# Patient Record
Sex: Female | Born: 1940
Health system: Southern US, Community
[De-identification: ages and names within clinical notes are randomized; demographics above are authoritative.]

---

## 2016-01-15 DIAGNOSIS — H25811 Combined forms of age-related cataract, right eye: Secondary | ICD-10-CM | POA: Diagnosis not present

## 2016-01-15 DIAGNOSIS — H40052 Ocular hypertension, left eye: Secondary | ICD-10-CM | POA: Diagnosis not present

## 2016-01-15 DIAGNOSIS — H25812 Combined forms of age-related cataract, left eye: Secondary | ICD-10-CM | POA: Diagnosis not present

## 2016-01-26 DIAGNOSIS — H2512 Age-related nuclear cataract, left eye: Secondary | ICD-10-CM | POA: Diagnosis not present

## 2016-01-26 DIAGNOSIS — H25812 Combined forms of age-related cataract, left eye: Secondary | ICD-10-CM | POA: Diagnosis not present

## 2016-02-26 DIAGNOSIS — H2511 Age-related nuclear cataract, right eye: Secondary | ICD-10-CM | POA: Diagnosis not present

## 2016-02-26 DIAGNOSIS — H25811 Combined forms of age-related cataract, right eye: Secondary | ICD-10-CM | POA: Diagnosis not present

## 2017-08-29 ENCOUNTER — Encounter (HOSPITAL_COMMUNITY): Payer: Self-pay

## 2017-08-29 ENCOUNTER — Emergency Department (HOSPITAL_COMMUNITY): Payer: Medicare Other

## 2017-08-29 ENCOUNTER — Other Ambulatory Visit: Payer: Self-pay

## 2017-08-29 ENCOUNTER — Emergency Department (HOSPITAL_COMMUNITY)
Admission: EM | Admit: 2017-08-29 | Discharge: 2017-08-29 | Disposition: A | Discharge status: Home / Self Care | Payer: Medicare Other | Attending: Emergency Medicine | Admitting: Emergency Medicine

## 2017-08-29 DIAGNOSIS — R0789 Other chest pain: Secondary | ICD-10-CM | POA: Diagnosis not present

## 2017-08-29 DIAGNOSIS — Y929 Unspecified place or not applicable: Secondary | ICD-10-CM | POA: Insufficient documentation

## 2017-08-29 DIAGNOSIS — Z87891 Personal history of nicotine dependence: Secondary | ICD-10-CM | POA: Diagnosis not present

## 2017-08-29 DIAGNOSIS — Y999 Unspecified external cause status: Secondary | ICD-10-CM | POA: Insufficient documentation

## 2017-08-29 DIAGNOSIS — X58XXXA Exposure to other specified factors, initial encounter: Secondary | ICD-10-CM | POA: Diagnosis not present

## 2017-08-29 DIAGNOSIS — Y9301 Activity, walking, marching and hiking: Secondary | ICD-10-CM | POA: Insufficient documentation

## 2017-08-29 DIAGNOSIS — S299XXA Unspecified injury of thorax, initial encounter: Secondary | ICD-10-CM | POA: Diagnosis present

## 2017-08-29 DIAGNOSIS — S29012A Strain of muscle and tendon of back wall of thorax, initial encounter: Secondary | ICD-10-CM

## 2017-08-29 DIAGNOSIS — I10 Essential (primary) hypertension: Secondary | ICD-10-CM

## 2017-08-29 DIAGNOSIS — R079 Chest pain, unspecified: Secondary | ICD-10-CM | POA: Diagnosis not present

## 2017-08-29 LAB — CBC
HEMATOCRIT: 43.7 % (ref 36.0–46.0)
Hemoglobin: 14.2 g/dL (ref 12.0–15.0)
MCH: 30.5 pg (ref 26.0–34.0)
MCHC: 32.5 g/dL (ref 30.0–36.0)
MCV: 93.8 fL (ref 78.0–100.0)
Platelets: 365 10*3/uL (ref 150–400)
RBC: 4.66 MIL/uL (ref 3.87–5.11)
RDW: 12.4 % (ref 11.5–15.5)
WBC: 8.8 10*3/uL (ref 4.0–10.5)

## 2017-08-29 LAB — BASIC METABOLIC PANEL
Anion gap: 10 (ref 5–15)
BUN: 15 mg/dL (ref 8–23)
CO2: 25 mmol/L (ref 22–32)
Calcium: 9.5 mg/dL (ref 8.9–10.3)
Chloride: 106 mmol/L (ref 98–111)
Creatinine, Ser: 0.91 mg/dL (ref 0.44–1.00)
GFR calc Af Amer: >60 mL/min (ref >60)
GFR, EST NON AFRICAN AMERICAN: 59 mL/min — AB (ref >60)
Glucose, Bld: 112 mg/dL — ABNORMAL HIGH (ref 70–99)
POTASSIUM: 3.9 mmol/L (ref 3.5–5.1)
SODIUM: 141 mmol/L (ref 135–145)

## 2017-08-29 LAB — I-STAT TROPONIN, ED: TROPONIN I, POC: 0.02 ng/mL (ref 0.00–0.08)

## 2017-08-29 MED ORDER — METHOCARBAMOL 500 MG PO TABS: 500.0000 mg | ORAL_TABLET | Freq: Two times a day (BID) | ORAL | 0 refills | Status: AC | PRN

## 2017-08-29 MED ORDER — METHOCARBAMOL 500 MG PO TABS
500.0000 mg | ORAL_TABLET | Freq: Once | ORAL | Status: AC
  Administered 2017-08-29: 500 mg via ORAL
  Filled 2017-08-29: qty 1

## 2017-08-29 NOTE — ED Notes (Signed)
ED Provider at bedside. 

## 2017-08-29 NOTE — ED Provider Notes (Signed)
[facility] EMERGENCY DEPARTMENT Provider Note   CSN: [id number] Arrival date & time: 08/29/17  1051     History   Chief Complaint Chief Complaint  Patient presents with  . Chest Pain    HPI Regina Bautista is a 77 y.o. female.  HPI 77 year old female presents with a chief complaint of thoracic back pain.  Started 5 days ago while she was walking her dog.  Her dog likes to pull and tug and while she was holding the leash with her right arm she felt some pain in her thoracic back.  She is been trying ice, icy spray, ibuprofen until yesterday and Tylenol.  Some partial relief with these.  Some mild pain in her right upper arm and in her right chest.  No shortness of breath or pleuritic pain.  Movement worsens the pain.  No weakness or numbness or radiation of the pain.  No abdominal pain or vomiting.  No cough.  Pain is about an 8 out of 10.  She checked her blood pressure this morning and it was 190 systolic and it was telling her that she had an irregular heartbeat so she came to get checked out.  She has not felt any palpitations.  History reviewed. No pertinent past medical history.  There are no active problems to display for this patient.   History reviewed. No pertinent surgical history.   OB History   None      Home Medications    Prior to Admission medications   Not on File    Family History History reviewed. No pertinent family history.  Social History Social History   Tobacco Use  . Smoking status: Former Smoker    Last attempt to quit: 08/16/2017    Years since quitting: 0.0  . Smokeless tobacco: Never Used  Substance Use Topics  . Alcohol use: Yes    Comment: occ  . Drug use: Not on file     Allergies   Penicillins and Sulfa antibiotics   Review of Systems Review of Systems  Respiratory: Negative for shortness of breath.   Cardiovascular: Positive for chest pain.  Gastrointestinal: Negative for abdominal pain and vomiting.    Musculoskeletal: Positive for back pain and myalgias.  Neurological: Negative for weakness and numbness.  All other systems reviewed and are negative.    Physical Exam Updated Vital Signs BP (!) 147/84 (BP Location: Right Arm)   Pulse 93   Temp 98.1 F (36.7 C) (Oral)   Resp 18   SpO2 96%   Physical Exam  Constitutional: She is oriented to person, place, and time. She appears well-developed and well-nourished.  HENT:  Head: Normocephalic and atraumatic.  Right Ear: External ear normal.  Left Ear: External ear normal.  Nose: Nose normal.  Eyes: Right eye exhibits no discharge. Left eye exhibits no discharge.  Cardiovascular: Normal rate, regular rhythm and normal heart sounds.  Pulses:      Radial pulses are 2+ on the right side, and 2+ on the left side.  Pulmonary/Chest: Effort normal and breath sounds normal. She exhibits tenderness (mild).    Abdominal: Soft. There is no tenderness.  Musculoskeletal:       Cervical back: She exhibits tenderness.       Thoracic back: She exhibits tenderness.       Back:       Right upper arm: She exhibits tenderness (mild, posterior, no swelling). She exhibits no bony tenderness.  Neurological: She is alert and oriented  to person, place, and time.  Skin: Skin is warm and dry.  Nursing note and vitals reviewed.    ED Treatments / Results  Labs (all labs ordered are listed, but only abnormal results are displayed) Labs Reviewed  BASIC METABOLIC PANEL - Abnormal; Notable for the following components:      Result Value   Glucose, Bld 112 (*)    GFR calc non Af Amer 59 (*)    All other components within normal limits  CBC  I-STAT TROPONIN, ED    EKG EKG Interpretation  Date/Time:  Monday August 29 2017 10:57:35 EDT Ventricular Rate:  94 PR Interval:  142 QRS Duration: 64 QT Interval:  352 QTC Calculation: 440 R Axis:   11 Text Interpretation:  Normal sinus rhythm with sinus arrhythmia Cannot rule out Anterior infarct ,  age undetermined Abnormal ECG nonspecific ST segments diffusely Confirmed by Pricilla LovelessGoldston, Earma Nicolaou [phone]([id number]) on 08/29/2017 11:35:29 AM   EKG Interpretation  Date/Time:  Monday August 29 2017 12:26:03 EDT Ventricular Rate:  80 PR Interval:  142 QRS Duration: 81 QT Interval:  390 QTC Calculation: 450 R Axis:   12 Text Interpretation:  Sinus rhythm nonspecific ST segments, unchanged from earlier in the day Confirmed by Pricilla LovelessGoldston, Kori Colin [phone]([id number]) on 08/29/2017 12:33:25 PM       Radiology Dg Chest 2 View  Result Date: 08/29/2017 CLINICAL DATA:  Right-sided chest pain EXAM: CHEST - 2 VIEW COMPARISON:  None. FINDINGS: The heart size and mediastinal contours are within normal limits. Both lungs are hyperinflated. The visualized skeletal structures are unremarkable. IMPRESSION: COPD without acute abnormality. Electronically Signed   By: Alcide CleverMark  Lukens M.D.   On: 08/29/2017 11:20    Procedures Procedures (including critical care time)  Medications Ordered in ED Medications  methocarbamol (ROBAXIN) tablet 500 mg (has no administration in time range)     Initial Impression / Assessment and Plan / ED Course  I have reviewed the triage vital signs and the nursing notes.  Pertinent labs & imaging results that were available during my care of the patient were reviewed by me and considered in my medical decision making (see chart for details).     Patient's pain appears to be muscular.  While there is a little bit of right-sided chest pain this also appears muscular.  I highly doubt ACS, PE, dissection.  She is noted to be hypertensive although she is also uncomfortable and in pain.  In this acute episode I do not think she needs to be immediately started on blood pressure medicine but needs to find a PCP for better outpatient management of this and reevaluation.  I will start her on a muscle relaxer and encouraged her to use low-dose NSAIDs and Tylenol.  She has some nonspecific ST segments but no significant  elevation or depression to suggest a coronary cause.  These are unchanging.  Given the length of symptoms, with a negative troponin I do not think further troponin testing is needed.  We discussed return precautions but she appears stable for discharge home.  Final Clinical Impressions(s) / ED Diagnoses   Final diagnoses:  Strain of thoracic back region  Essential hypertension    ED Discharge Orders    None       Pricilla LovelessGoldston, Kairen Hallinan, MD 08/29/17 1622

## 2017-08-29 NOTE — ED Triage Notes (Signed)
Pt states she was walking her dog and he jerked her. She states she initially thought maybe she pulled a muscle but she has had persistent chest tightness and pain in her right arm. Pt also reports hypertension this morning.

## 2017-08-29 NOTE — ED Notes (Signed)
Patient ready for discharge VS documented

## 2017-08-29 NOTE — Discharge Instructions (Addendum)
If your pain worsens or you develop weakness or numbness in your arms or legs, incontinence, headache, chest pain or shortness of breath, vomiting, or any other new/concerning symptoms and return to the ER for evaluation.  Do not combine the muscle relaxer, Robaxin, with any other medicines, alcohol.  Do not drive or operate heavy machinery while using this medicine.  You will need to get your blood pressure rechecked with a primary care doctor

## 2017-09-20 DIAGNOSIS — R030 Elevated blood-pressure reading, without diagnosis of hypertension: Secondary | ICD-10-CM | POA: Diagnosis not present

## 2017-09-20 DIAGNOSIS — M5412 Radiculopathy, cervical region: Secondary | ICD-10-CM | POA: Diagnosis not present

## 2017-09-21 ENCOUNTER — Inpatient Hospital Stay: Payer: Medicare Other

## 2017-10-03 ENCOUNTER — Ambulatory Visit: Payer: Medicare Other | Attending: Family Medicine

## 2017-10-03 ENCOUNTER — Other Ambulatory Visit: Payer: Self-pay

## 2017-10-03 DIAGNOSIS — M79601 Pain in right arm: Secondary | ICD-10-CM

## 2017-10-03 DIAGNOSIS — R293 Abnormal posture: Secondary | ICD-10-CM | POA: Diagnosis not present

## 2017-10-03 DIAGNOSIS — M542 Cervicalgia: Secondary | ICD-10-CM | POA: Diagnosis not present

## 2017-10-03 DIAGNOSIS — R252 Cramp and spasm: Secondary | ICD-10-CM | POA: Diagnosis not present

## 2017-10-03 NOTE — Patient Instructions (Signed)
Access Code: [email or web address]  URL: [email or web address]  Date: 10/03/2017  Prepared by: Lorrene Reid   Exercises  Seated Cervical Sidebending AROM - 10 reps - 3 sets - 1x daily - 7x weekly  Seated Cervical Retraction - 10 reps - 3 sets - 5 hold - 3x daily - 7x weekly  Seated Correct Posture - 10 reps - 3 sets - 1x daily - 7x weekly  Supine Chin Tuck - 10 reps - 3 sets - 1x daily - 7x weekly  Seated Scapular Retraction - 10 reps - 2 sets - 5 hold - 3x daily - 7x weekly

## 2017-10-03 NOTE — Therapy (Signed)
[facility] [address], [address] [place], [place], [address] Phone: [phone]   Fax:  [phone]  Physical Therapy Evaluation  Patient Details  Name: Regina Bautista MRN: [id number] Date of Birth: 1940-08-14 Referring Provider (PT): Darrow Bussing, MD   Encounter Date: 10/03/2017  PT End of Session - 10/03/17 1329    Visit Number  1    Date for PT Re-Evaluation  11/28/17    Authorization Type  Medicare    PT Start Time  1233    PT Stop Time  1313    PT Time Calculation (min)  40 min    Activity Tolerance  Patient tolerated treatment well    Behavior During Therapy  [facility] for tasks assessed/performed       History reviewed. No pertinent past medical history.  History reviewed. No pertinent surgical history.  There were no vitals filed for this visit.   Subjective Assessment - 10/03/17 1233    Subjective  Pt presents to PT with Rt UE radiculopathy that began 08/17/17.  Pt was walking her dog and the dog pulled on her arm and pain began at that time.  Pt reports that she has constant Rt UE radiculopathy and has difficulty sleeping at night.      Diagnostic tests  none    Patient Stated Goals  sleep better, reduce Rt UE pain, improve use of Rt UE    Currently in Pain?  Yes    Pain Score  8     Pain Location  Arm    Pain Orientation  Right    Pain Descriptors / Indicators  Numbness;Tingling    Pain Type  Acute pain    Pain Onset  More than a month ago    Pain Frequency  Constant    Aggravating Factors   sleep at night, brushing teeth, use of Rt UE to clean     Pain Relieving Factors  propping arm, arm overhead         [facility] PT Assessment - 10/03/17 0001      Assessment   Medical Diagnosis  cervical radicuopathy    Referring Provider (PT)  Docia Chuck, Dibas, MD    Onset Date/Surgical Date  08/17/17    Hand Dominance  Right    Next MD Visit  none    Prior Therapy  none      Precautions   Precautions  None       Restrictions   Weight Bearing Restrictions  No      Balance Screen   Has the patient fallen in the past 6 months  No    Has the patient had a decrease in activity level because of a fear of falling?   No    Is the patient reluctant to leave their home because of a fear of falling?   No      Home Public house manager residence    Living Arrangements  Alone    Home Layout  Two level      Prior Function   Level of Independence  Independent    Vocation  Retired    Leisure  walk dog, Public house manager   Overall Cognitive Status  Within Functional Limits for tasks assessed      Observation/Other Assessments   Focus on Therapeutic Outcomes (FOTO)   37% limitation      Posture/Postural Control   Posture/Postural Control  Postural limitations  Postural Limitations  Rounded Shoulders;Weight shift left;Weight shift right      ROM / Strength   AROM / PROM / Strength  AROM;Strength      AROM   Overall AROM   Within functional limits for tasks performed    Overall AROM Comments  cervical A/ROM is full without change in Rt UE pain.  UE A/ROM is full without change in UE pain      Strength   Overall Strength  Within functional limits for tasks performed    Overall Strength Comments  Rt=Lt UE strength.  Lt grip 48#, Rt grip 45#      Palpation   Spinal mobility  reduced PA mobiltiy C3-6 without pain    Palpation comment  palpable tension and trigger points over Rt upper trap, Rt medial scapular and thoracic paraspinals      Special Tests    Special Tests  Cervical;Thoracic Outlet Syndrome    Cervical Tests  Dictraction    Thoracic Outlet Syndrome   Costcoclavicular Syndrome Test;Arms overhead      Distraction Test   Findngs  Positive    side  Right      Costcoclavicular Syndrome Test   Findings  Negative    Side  Right      Arms overhead    Findings  Negative    Side  Right      Ambulation/Gait   Gait Pattern  Within Functional Limits                 Objective measurements completed on examination: See above findings.              PT Education - 10/03/17 1310    Education Details   Access Code: [email or web address]     Person(s) Educated  Patient    Methods  Explanation;Demonstration;Handout    Comprehension  Verbalized understanding;Returned demonstration       PT Short Term Goals - 10/03/17 1226      PT SHORT TERM GOAL #1   Title  be independent in initial HEP    Time  4    Period  Weeks    Status  New    Target Date  10/31/17      PT SHORT TERM GOAL #2   Title  report a 40% reduction in Rt UE with ADLs and self-care    Time  4    Period  Weeks    Status  New    Target Date  10/31/17      PT SHORT TERM GOAL #3   Title  reduce Rt UE pain to report  50% improvement in quality and quantity of sleep at night    Time  4    Period  Weeks    Status  New    Target Date  10/31/17        PT Long Term Goals - 10/03/17 1228      PT LONG TERM GOAL #1   Title  be independent in advanced HEP    Time  8    Period  Weeks    Status  New    Target Date  11/28/17      PT LONG TERM GOAL #2   Title  reduce FOTO to < or = to 28% limitation    Time  8    Period  Weeks    Status  New    Target Date  11/28/17      PT LONG TERM GOAL #  3   Title  report a 75% reduction in Rt UE radiculopathy to improve use of Rt UE with ADLs and self-care    Time  8    Period  Weeks    Status  New    Target Date  11/28/17      PT LONG TERM GOAL #4   Title  report waking < or = to 1x/night with Rt UE pain    Time  8    Period  Weeks    Status  New    Target Date  11/28/17      PT LONG TERM GOAL #5   Title  demonstrate neutral seated posture and report postural corrections with painting and sitting for long periods    Time  8    Period  Weeks    Status  New    Target Date  11/28/17             Plan - 10/03/17 1326    Clinical Impression Statement  Pt is a Rt hand dominant female who presents to PT with  complaints of 8-10/10 Rt UE radiculopathy that began ~6 weeks ago when her dog pulled her while walking.  Pt has not had any imaging.  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  Pt demonstrates full cervical A/ROM with Rt sided neck pain reported with all cervical A/ROM.  Pt with forward head and rounded shoulder posture and lateral trunk shift with sitting during evaluation.  Pt with palpable trigger points in Rt upper trap and scapula.  Pt will benefit from skilled PT for cervical traction, postural strength, dry needling and modalities to address neck and UE pain.      History and Personal Factors relevant to plan of care:  none    Clinical Presentation  Stable    Clinical Presentation due to:  Rt UE radiculopathy- no other comorbidities    Clinical Decision Making  Low    Rehab Potential  Excellent    PT Frequency  2x / week    PT Duration  8 weeks    PT Treatment/Interventions  ADLs/Self Care Home Management;Cryotherapy;Electrical Stimulation;Ultrasound;Traction;Iontophoresis 4mg /ml Dexamethasone;Therapeutic activities;Therapeutic exercise;Patient/family education;Neuromuscular re-education;Manual techniques;Passive range of motion;Taping;Dry needling    PT Next Visit Plan  cervical traction, dry needling to Rt upper trap and scapula, manual     PT Home Exercise Plan  Access Code: [email or web address]     Consulted and Agree with Plan of Care  Patient       Patient will benefit from skilled therapeutic intervention in order to improve the following deficits and impairments:  Impaired UE functional use, Decreased activity tolerance, Postural dysfunction, Increased muscle spasms, Improper body mechanics, Pain  Visit Diagnosis: Pain in right arm - Plan: PT plan of care cert/re-cert  Cervicalgia - Plan: PT plan of care cert/re-cert  Abnormal posture - Plan: PT plan of care cert/re-cert  Cramp and spasm - Plan: PT plan of care  cert/re-cert     Problem List There are no active problems to display for this patient.    Lorrene Reid, PT 10/03/17 1:30 PM   [facility] [address], [address] [place], [place], [address] Phone: [phone]   Fax:  [phone]  Name: Regina Bautista MRN: [id number] Date of Birth: 1940/10/13

## 2017-10-07 ENCOUNTER — Encounter

## 2017-10-10 ENCOUNTER — Encounter: Payer: Self-pay | Admitting: Physical Therapy

## 2017-10-10 ENCOUNTER — Ambulatory Visit: Payer: Medicare Other | Attending: Family Medicine | Admitting: Physical Therapy

## 2017-10-10 DIAGNOSIS — R293 Abnormal posture: Secondary | ICD-10-CM | POA: Insufficient documentation

## 2017-10-10 DIAGNOSIS — R252 Cramp and spasm: Secondary | ICD-10-CM

## 2017-10-10 DIAGNOSIS — M542 Cervicalgia: Secondary | ICD-10-CM | POA: Diagnosis not present

## 2017-10-10 DIAGNOSIS — M79601 Pain in right arm: Secondary | ICD-10-CM | POA: Diagnosis not present

## 2017-10-10 NOTE — Therapy (Signed)
[facility] [address] [place], [place], [address] Phone: [phone]   Fax:  [phone]  Physical Therapy Treatment  Patient Details  Name: Regina Bautista MRN: [id number] Date of Birth: 1940/02/04 Referring Provider (PT): Darrow Bussing, MD   Encounter Date: 10/10/2017  PT End of Session - 10/10/17 1529    Visit Number  2    Date for PT Re-Evaluation  11/28/17    Authorization Type  Medicare    PT Start Time  1530    PT Stop Time  1612    PT Time Calculation (min)  42 min    Activity Tolerance  Patient tolerated treatment well    Behavior During Therapy  [facility] for tasks assessed/performed       History reviewed. No pertinent past medical history.  History reviewed. No pertinent surgical history.  There were no vitals filed for this visit.  Subjective Assessment - 10/10/17 1535    Subjective  Symptoms are about the same. I am doing my HEP faithfully.    Currently in Pain?  Yes    Pain Score  6     Pain Location  Shoulder    Pain Orientation  Right;Posterior   and down arm RTUE   Pain Descriptors / Indicators  Tingling;Tightness    Aggravating Factors   brushing teeth, doing dishes    Pain Relieving Factors  propping up arm    Multiple Pain Sites  No                       [facility] Adult PT Treatment/Exercise - 10/10/17 0001      Neck Exercises: Seated   Neck Retraction  10 reps   VC to go easy with effort   Lateral Flexion  Both;10 reps   VC to practice relaxing her shoulders, not hiking   Other Seated Exercise  scap squeezes 10x   VC to ease effort and glide scap together     Traction   Type of Traction  Cervical   Towel props upper arm Bil   Min (lbs)  5    Max (lbs)  12    Hold Time  60    Rest Time  20    Time  15      Neck Exercises: Stretches   Neural Stretch  RTUE ulnar n stretch: 3x 5 sec added to HEP    Other Neck Stretches  Ball on the wall at RT scapula for home TP  work             PT Education - 10/10/17 1552    Education Details  Ulnar glides for RTUE for HEP    Person(s) Educated  Patient    Methods  Explanation;Demonstration;Tactile cues;Verbal cues;Handout    Comprehension  Returned demonstration;Verbalized understanding       PT Short Term Goals - 10/03/17 1226      PT SHORT TERM GOAL #1   Title  be independent in initial HEP    Time  4    Period  Weeks    Status  New    Target Date  10/31/17      PT SHORT TERM GOAL #2   Title  report a 40% reduction in Rt UE with ADLs and self-care    Time  4    Period  Weeks    Status  New    Target Date  10/31/17      PT  SHORT TERM GOAL #3   Title  reduce Rt UE pain to report  50% improvement in quality and quantity of sleep at night    Time  4    Period  Weeks    Status  New    Target Date  10/31/17        PT Long Term Goals - 10/03/17 1228      PT LONG TERM GOAL #1   Title  be independent in advanced HEP    Time  8    Period  Weeks    Status  New    Target Date  11/28/17      PT LONG TERM GOAL #2   Title  reduce FOTO to < or = to 28% limitation    Time  8    Period  Weeks    Status  New    Target Date  11/28/17      PT LONG TERM GOAL #3   Title  report a 75% reduction in Rt UE radiculopathy to improve use of Rt UE with ADLs and self-care    Time  8    Period  Weeks    Status  New    Target Date  11/28/17      PT LONG TERM GOAL #4   Title  report waking < or = to 1x/night with Rt UE pain    Time  8    Period  Weeks    Status  New    Target Date  11/28/17      PT LONG TERM GOAL #5   Title  demonstrate neutral seated posture and report postural corrections with painting and sitting for long periods    Time  8    Period  Weeks    Status  New    Target Date  11/28/17            Plan - 10/10/17 1530    Clinical Impression Statement  Pt compliant with her initial HEP. She needed follow up education today to decrease her effort/resistance with  retraction exercises as they would cause pain. Once she eased her efforts she had no pain performing the exercise. Mechanical cervical traction was started today. pt was also educated in how to use a tennis ball at home on her wall to defuse her Rt scapular trigger point.     Rehab Potential  Excellent    PT Frequency  2x / week    PT Duration  8 weeks    PT Treatment/Interventions  ADLs/Self Care Home Management;Cryotherapy;Electrical Stimulation;Ultrasound;Traction;Iontophoresis 4mg /ml Dexamethasone;Therapeutic activities;Therapeutic exercise;Patient/family education;Neuromuscular re-education;Manual techniques;Passive range of motion;Taping;Dry needling    PT Next Visit Plan  Assess cervical traction, see how new nerve stretch is going,  manual     PT Home Exercise Plan  Access Code: [email or web address]     Consulted and Agree with Plan of Care  Patient       Patient will benefit from skilled therapeutic intervention in order to improve the following deficits and impairments:  Impaired UE functional use, Decreased activity tolerance, Postural dysfunction, Increased muscle spasms, Improper body mechanics, Pain  Visit Diagnosis: Pain in right arm  Cervicalgia  Abnormal posture  Cramp and spasm     Problem List There are no active problems to display for this patient.   Khadijah Mastrianni, PTA 10/10/2017, 4:14 PM  [place] [facility] [address] [place], [place], [address] Phone: [phone]   Fax:  [phone]  Name:  Regina Bautista MRN: [id number] Date of Birth: 26-May-1940  Access Code: [email or web address]  URL: [email or web address]  Date: 10/10/2017  Prepared by: Ane Payment   Exercises  Seated Cervical Sidebending AROM - 10 reps - 3 sets - 1x daily - 7x weekly  Seated Cervical Retraction - 10 reps - 3 sets - 5 hold - 3x daily - 7x weekly  Seated Correct Posture - 10 reps - 3 sets - 1x daily - 7x weekly  Supine Chin  Tuck - 10 reps - 3 sets - 1x daily - 7x weekly  Seated Scapular Retraction - 10 reps - 2 sets - 5 hold - 3x daily - 7x weekly  Standing Ulnar Nerve Glide - 5 reps - 1 sets - 5 hold - 2x daily - 7x weekly

## 2017-10-10 NOTE — Patient Instructions (Signed)
For scapular squeezes:  1. Tall posture first, lift your heart GENTLY  2. GLIDE the shoulder blades together gently and recognize those muscles, then hold them 5 seconds.

## 2017-10-12 ENCOUNTER — Encounter: Payer: Self-pay | Admitting: Physical Therapy

## 2017-10-12 ENCOUNTER — Ambulatory Visit: Payer: Medicare Other | Admitting: Physical Therapy

## 2017-10-12 DIAGNOSIS — M542 Cervicalgia: Secondary | ICD-10-CM | POA: Diagnosis not present

## 2017-10-12 DIAGNOSIS — M79601 Pain in right arm: Secondary | ICD-10-CM | POA: Diagnosis not present

## 2017-10-12 DIAGNOSIS — R293 Abnormal posture: Secondary | ICD-10-CM | POA: Diagnosis not present

## 2017-10-12 DIAGNOSIS — R252 Cramp and spasm: Secondary | ICD-10-CM

## 2017-10-12 NOTE — Therapy (Signed)
[facility] [address], [address] [place], [place], [address] Phone: [phone]   Fax:  [phone]  Physical Therapy Treatment  Patient Details  Name: Regina Bautista MRN: [id number] Date of Birth: 1940/08/18 Referring Provider (PT): Darrow Bussing, MD   Encounter Date: 10/12/2017  PT End of Session - 10/12/17 1218    Visit Number  3    Date for PT Re-Evaluation  11/28/17    Authorization Type  Medicare    PT Start Time  1214    PT Stop Time  1310    PT Time Calculation (min)  56 min    Activity Tolerance  Patient tolerated treatment well    Behavior During Therapy  [facility] for tasks assessed/performed       History reviewed. No pertinent past medical history.  History reviewed. No pertinent surgical history.  There were no vitals filed for this visit.  Subjective Assessment - 10/12/17 1219    Subjective  Symptoms unchanged. Last session did not make me worse, but I remians with RTarm symptoms. Denies numbness or tingling this AM.     Patient Stated Goals  sleep better, reduce Rt UE pain, improve use of Rt UE    Currently in Pain?  Yes    Pain Score  5     Pain Location  Arm   Posterior aspect   Pain Orientation  Right    Pain Descriptors / Indicators  Sore    Multiple Pain Sites  No                       [facility] Adult PT Treatment/Exercise - 10/12/17 0001      Shoulder Exercises: Supine   Horizontal ABduction  Strengthening;Both;10 reps;Theraband    Theraband Level (Shoulder Horizontal ABduction)  Level 1 (Yellow)    Diagonals  Strengthening;Both;10 reps;Theraband   Bil   Theraband Level (Shoulder Diagonals)  Level 1 (Yellow)      Traction   Type of Traction  Cervical    Min (lbs)  5    Max (lbs)  14    Hold Time  60    Rest Time  20    Time  15      Neck Exercises: Stretches   Other Neck Stretches  Ball on the wall at RT scapula for home TP work    Other Neck Stretches  Slow head  rotations on blue foam roll 10x, then figure eights.10x             PT Education - 10/12/17 1237    Education Details  Radial glides & mobilization for HEP    Person(s) Educated  Patient    Methods  Explanation;Demonstration;Tactile cues;Verbal cues;Handout    Comprehension  Returned demonstration;Verbalized understanding       PT Short Term Goals - 10/12/17 1225      PT SHORT TERM GOAL #3   Title  reduce Rt UE pain to report  50% improvement in quality and quantity of sleep at night    Time  4    Period  Weeks    Status  On-going   Making progress, not as intense now not sure on %.       PT Long Term Goals - 10/03/17 1228      PT LONG TERM GOAL #1   Title  be independent in advanced HEP    Time  8    Period  Weeks    Status  New    Target Date  11/28/17      PT LONG TERM GOAL #2   Title  reduce FOTO to < or = to 28% limitation    Time  8    Period  Weeks    Status  New    Target Date  11/28/17      PT LONG TERM GOAL #3   Title  report a 75% reduction in Rt UE radiculopathy to improve use of Rt UE with ADLs and self-care    Time  8    Period  Weeks    Status  New    Target Date  11/28/17      PT LONG TERM GOAL #4   Title  report waking < or = to 1x/night with Rt UE pain    Time  8    Period  Weeks    Status  New    Target Date  11/28/17      PT LONG TERM GOAL #5   Title  demonstrate neutral seated posture and report postural corrections with painting and sitting for long periods    Time  8    Period  Weeks    Status  New    Target Date  11/28/17            Plan - 10/12/17 1218    Clinical Impression Statement  Symptoms remain unchanged per pt. She does report her sleeping has improved since her pain at night is more tolerable. She could not put a % on it today.  Mechanical traction did not increase her symptoms so we increased pull to 14# today. Additionally pt was given radial nerve stretches to replace the original nerve stretching for the  ulnar nerve. Radial nerve aappears to be more involved.     Rehab Potential  Excellent    PT Frequency  2x / week    PT Duration  8 weeks    PT Treatment/Interventions  ADLs/Self Care Home Management;Cryotherapy;Electrical Stimulation;Ultrasound;Traction;Iontophoresis 4mg /ml Dexamethasone;Therapeutic activities;Therapeutic exercise;Patient/family education;Neuromuscular re-education;Manual techniques;Passive range of motion;Taping;Dry needling    PT Next Visit Plan  Assess traction, review radial nerve stretch.     PT Home Exercise Plan  Access Code: [email or web address]     Consulted and Agree with Plan of Care  Patient       Patient will benefit from skilled therapeutic intervention in order to improve the following deficits and impairments:  Impaired UE functional use, Decreased activity tolerance, Postural dysfunction, Increased muscle spasms, Improper body mechanics, Pain  Visit Diagnosis: Pain in right arm  Cervicalgia  Abnormal posture  Cramp and spasm     Problem List There are no active problems to display for this patient.   Rosalba Totty, PTA 10/12/2017, 12:59 PM  [facility] [address] [place], [place], [address] Phone: [phone]   Fax:  [phone]  Name: Regina Bautista MRN: [id number] Date of Birth: 17-Aug-1940  Access Code: [email or web address]  URL: [email or web address]  Date: 10/12/2017  Prepared by: [employer]   Access Code: [email or web address]  URL: [email or web address]  Date: 10/12/2017  Prepared by: [employer]   Exercises  Seated Cervical Sidebending AROM - 10 reps - 3 sets - 1x daily - 7x weekly  Seated Cervical Retraction - 10 reps - 3 sets - 5 hold - 3x daily - 7x weekly  Seated Correct Posture - 10 reps - 3 sets - 1x daily - 7x weekly  Supine Chin Tuck - 10 reps -  3 sets - 1x daily - 7x weekly  Seated Scapular Retraction - 10 reps - 2 sets - 5 hold - 3x daily - 7x  weekly  Standing Ulnar Nerve Glide - 5 reps - 1 sets - 5 hold - 2x daily - 7x weekly  Radial Nerve Mobilization - 5 reps - 5 hold - 2x daily - 7x weekly  Standing Radial Nerve Glide - 5 reps - 1 sets - 5 hold - 2x daily - 7x weekly  Supine Shoulder Horizontal Abduction with Resistance - 10 reps - 2 sets - 2x daily - 7x weekly  Standing Shoulder Diagonal Horizontal Abduction 60/120 Degrees with Resistance - 10 reps - 2 sets - 2x daily - 7x weekly

## 2017-10-19 ENCOUNTER — Ambulatory Visit: Payer: Medicare Other

## 2017-10-19 DIAGNOSIS — M79601 Pain in right arm: Secondary | ICD-10-CM | POA: Diagnosis not present

## 2017-10-19 DIAGNOSIS — M542 Cervicalgia: Secondary | ICD-10-CM | POA: Diagnosis not present

## 2017-10-19 DIAGNOSIS — R293 Abnormal posture: Secondary | ICD-10-CM | POA: Diagnosis not present

## 2017-10-19 DIAGNOSIS — R252 Cramp and spasm: Secondary | ICD-10-CM

## 2017-10-19 NOTE — Patient Instructions (Signed)

## 2017-10-19 NOTE — Therapy (Signed)
[facility] [address] [place], [place], [address] Phone: [phone]   Fax:  [phone]  Physical Therapy Treatment  Patient Details  Name: Regina Bautista MRN: [id number] Date of Birth: January 07, 1940 Referring Provider (PT): Darrow Bussing, MD   Encounter Date: 10/19/2017  PT End of Session - 10/19/17 1221    Visit Number  4    Date for PT Re-Evaluation  11/28/17    Authorization Type  Medicare    PT Start Time  1145    PT Stop Time  1235    PT Time Calculation (min)  50 min    Activity Tolerance  Patient tolerated treatment well    Behavior During Therapy  [facility] for tasks assessed/performed       History reviewed. No pertinent past medical history.  History reviewed. No pertinent surgical history.  There were no vitals filed for this visit.  Subjective Assessment - 10/19/17 1145    Subjective  No changes in the Rt UE.  I have been doing the nerve glides and theraband exercises.      Patient Stated Goals  sleep better, reduce Rt UE pain, improve use of Rt UE    Currently in Pain?  Yes    Pain Score  4     Pain Location  Arm    Pain Orientation  Right    Pain Descriptors / Indicators  Sore    Pain Type  Acute pain    Pain Onset  More than a month ago    Aggravating Factors   using Rt UE with painting, doing dishes    Pain Relieving Factors  ice, theraband exercises                       [facility] Adult PT Treatment/Exercise - 10/19/17 0001      Traction   Type of Traction  Cervical    Min (lbs)  5    Max (lbs)  15    Hold Time  60    Rest Time  10    Time  15      Manual Therapy   Manual Therapy  Soft tissue mobilization;Myofascial release    Manual therapy comments  soft tissue elongation and trigger point release to Rt>Lt upper traps and bil cervical paraspinals       Trigger Point Dry Needling - 10/19/17 1222    Consent Given?  Yes    Education Handout Provided  Yes    Muscles  Treated Upper Body  Upper trapezius   bil cervical multifidi   Upper Trapezius Response  Twitch reponse elicited;Palpable increased muscle length           PT Education - 10/19/17 1147    Education Details  dry needling information    Person(s) Educated  Patient    Methods  Explanation;Demonstration;Handout    Comprehension  Verbalized understanding;Returned demonstration       PT Short Term Goals - 10/12/17 1225      PT SHORT TERM GOAL #3   Title  reduce Rt UE pain to report  50% improvement in quality and quantity of sleep at night    Time  4    Period  Weeks    Status  On-going   Making progress, not as intense now not sure on %.       PT Long Term Goals - 10/03/17 1228      PT LONG TERM GOAL #1  Title  be independent in advanced HEP    Time  8    Period  Weeks    Status  New    Target Date  11/28/17      PT LONG TERM GOAL #2   Title  reduce FOTO to < or = to 28% limitation    Time  8    Period  Weeks    Status  New    Target Date  11/28/17      PT LONG TERM GOAL #3   Title  report a 75% reduction in Rt UE radiculopathy to improve use of Rt UE with ADLs and self-care    Time  8    Period  Weeks    Status  New    Target Date  11/28/17      PT LONG TERM GOAL #4   Title  report waking < or = to 1x/night with Rt UE pain    Time  8    Period  Weeks    Status  New    Target Date  11/28/17      PT LONG TERM GOAL #5   Title  demonstrate neutral seated posture and report postural corrections with painting and sitting for long periods    Time  8    Period  Weeks    Status  New    Target Date  11/28/17            Plan - 10/19/17 1220    Clinical Impression Statement  Pt with continued Rt UE radiculopathy with minimal change in symptoms since the start of care.  Pt has trigger points in Rt upper traps and bil cervical paraspinals.  Pt had good response to dry needling and demonstrated improved tissue mobility after dry needling today and reported  0/10 Rt UE pain after manual therapy.  Traction was increased to 15# today.  Pt will continue to benefit from skilled PT to address Rt UE radiculopathy and muscle tension.      Rehab Potential  Excellent    PT Frequency  2x / week    PT Duration  8 weeks    PT Treatment/Interventions  ADLs/Self Care Home Management;Cryotherapy;Electrical Stimulation;Ultrasound;Traction;Iontophoresis 4mg /ml Dexamethasone;Therapeutic activities;Therapeutic exercise;Patient/family education;Neuromuscular re-education;Manual techniques;Passive range of motion;Taping;Dry needling    PT Next Visit Plan  Assess traction, review radial nerve stretch. Assess response to DN to RT upper trap and cervical multifidi.      PT Home Exercise Plan  Access Code: [email or web address]     Consulted and Agree with Plan of Care  Patient       Patient will benefit from skilled therapeutic intervention in order to improve the following deficits and impairments:  Impaired UE functional use, Decreased activity tolerance, Postural dysfunction, Increased muscle spasms, Improper body mechanics, Pain  Visit Diagnosis: Pain in right arm  Abnormal posture  Cramp and spasm  Cervicalgia     Problem List There are no active problems to display for this patient.  Lorrene Reid, PT 10/19/17 12:24 PM  [facility] [address] [place], [place], [address] Phone: [phone]   Fax:  [phone]  Name: Regina Bautista MRN: [id number] Date of Birth: 03/23/1940

## 2017-10-21 ENCOUNTER — Encounter: Payer: Self-pay | Admitting: Physical Therapy

## 2017-10-21 ENCOUNTER — Ambulatory Visit: Payer: Medicare Other | Admitting: Physical Therapy

## 2017-10-21 DIAGNOSIS — M79601 Pain in right arm: Secondary | ICD-10-CM | POA: Diagnosis not present

## 2017-10-21 DIAGNOSIS — M542 Cervicalgia: Secondary | ICD-10-CM | POA: Diagnosis not present

## 2017-10-21 DIAGNOSIS — R293 Abnormal posture: Secondary | ICD-10-CM | POA: Diagnosis not present

## 2017-10-21 DIAGNOSIS — R252 Cramp and spasm: Secondary | ICD-10-CM

## 2017-10-21 NOTE — Therapy (Signed)
[facility] [address], [address] [place], [place], [address] Phone: [phone]   Fax:  [phone]  Physical Therapy Treatment  Patient Details  Name: Regina Bautista MRN: [id number] Date of Birth: 09-17-1940 Referring Provider (PT): Darrow Bussing, MD   Encounter Date: 10/21/2017  PT End of Session - 10/21/17 0929    Visit Number  5    Date for PT Re-Evaluation  11/28/17    Authorization Type  Medicare    PT Start Time  0930    PT Stop Time  1010    PT Time Calculation (min)  40 min    Activity Tolerance  Patient tolerated treatment well    Behavior During Therapy  [facility] for tasks assessed/performed       History reviewed. No pertinent past medical history.  History reviewed. No pertinent surgical history.  There were no vitals filed for this visit.  Subjective Assessment - 10/21/17 0930    Subjective  Dry needling made pt a little sore, but not terrible. I think it may have helped'    Currently in Pain?  Yes    Pain Score  4     Pain Location  --   upper arm   Pain Orientation  Right    Pain Descriptors / Indicators  Sore    Multiple Pain Sites  No                       [facility] Adult PT Treatment/Exercise - 10/21/17 0001      Shoulder Exercises: Supine   Horizontal ABduction  Strengthening;Both;20 reps;Theraband    Theraband Level (Shoulder Horizontal ABduction)  Level 2 (Red)    Diagonals  Strengthening;Both;20 reps;Theraband    Theraband Level (Shoulder Diagonals)  Level 2 (Red)      Traction   Type of Traction  Cervical    Min (lbs)  5    Max (lbs)  16    Hold Time  60    Rest Time  10    Time  15      Neck Exercises: Stretches   Other Neck Stretches  Ball on the wall at RT scapula  TP work x 3 min               PT Short Term Goals - 10/12/17 1225      PT SHORT TERM GOAL #3   Title  reduce Rt UE pain to report  50% improvement in quality and quantity of sleep at night     Time  4    Period  Weeks    Status  On-going   Making progress, not as intense now not sure on %.       PT Long Term Goals - 10/03/17 1228      PT LONG TERM GOAL #1   Title  be independent in advanced HEP    Time  8    Period  Weeks    Status  New    Target Date  11/28/17      PT LONG TERM GOAL #2   Title  reduce FOTO to < or = to 28% limitation    Time  8    Period  Weeks    Status  New    Target Date  11/28/17      PT LONG TERM GOAL #3   Title  report a 75% reduction in Rt UE radiculopathy to improve use of Rt UE with  ADLs and self-care    Time  8    Period  Weeks    Status  New    Target Date  11/28/17      PT LONG TERM GOAL #4   Title  report waking < or = to 1x/night with Rt UE pain    Time  8    Period  Weeks    Status  New    Target Date  11/28/17      PT LONG TERM GOAL #5   Title  demonstrate neutral seated posture and report postural corrections with painting and sitting for long periods    Time  8    Period  Weeks    Status  New    Target Date  11/28/17            Plan - 10/21/17 0948    Clinical Impression Statement  Pt reports today the dry needling may have made her feel some better and is looking forward to next DN session. Mechanical traction was increased to 16# today without adverse effects. Pt continues to be compliant with her  HEP.  Band resistance was increased to red from yellow for HEP progression.     Rehab Potential  Excellent    PT Frequency  2x / week    PT Duration  8 weeks    PT Treatment/Interventions  ADLs/Self Care Home Management;Cryotherapy;Electrical Stimulation;Ultrasound;Traction;Iontophoresis 4mg /ml Dexamethasone;Therapeutic activities;Therapeutic exercise;Patient/family education;Neuromuscular re-education;Manual techniques;Passive range of motion;Taping;Dry needling    PT Next Visit Plan  DN if seen by PT, review red band scap exercises. Continue with mechanical cervical traction    PT Home Exercise Plan  Access  Code: [email or web address]     Consulted and Agree with Plan of Care  Patient       Patient will benefit from skilled therapeutic intervention in order to improve the following deficits and impairments:  Impaired UE functional use, Decreased activity tolerance, Postural dysfunction, Increased muscle spasms, Improper body mechanics, Pain  Visit Diagnosis: Pain in right arm  Abnormal posture  Cramp and spasm  Cervicalgia     Problem List There are no active problems to display for this patient.   Alban Marucci, PTA 10/21/2017, 9:51 AM  [facility] [address] [place], [place], [address] Phone: [phone]   Fax:  [phone]  Name: Regina Bautista MRN: [id number] Date of Birth: 05/10/1940

## 2017-10-24 ENCOUNTER — Ambulatory Visit: Payer: Medicare Other

## 2017-10-24 DIAGNOSIS — R252 Cramp and spasm: Secondary | ICD-10-CM | POA: Diagnosis not present

## 2017-10-24 DIAGNOSIS — M79601 Pain in right arm: Secondary | ICD-10-CM

## 2017-10-24 DIAGNOSIS — M542 Cervicalgia: Secondary | ICD-10-CM

## 2017-10-24 DIAGNOSIS — R293 Abnormal posture: Secondary | ICD-10-CM | POA: Diagnosis not present

## 2017-10-24 NOTE — Therapy (Signed)
[facility] [address], [address] [place], [place], [address] Phone: [phone]   Fax:  [phone]  Physical Therapy Treatment  Patient Details  Name: Regina Bautista MRN: [id number] Date of Birth: 10/07/1940 Referring Provider (PT): Darrow Bussing, MD   Encounter Date: 10/24/2017  PT End of Session - 10/24/17 1223    Visit Number  6    Date for PT Re-Evaluation  11/28/17    Authorization Type  Medicare    PT Start Time  1146    PT Stop Time  1233    PT Time Calculation (min)  47 min    Activity Tolerance  Patient tolerated treatment well    Behavior During Therapy  [facility] for tasks assessed/performed       History reviewed. No pertinent past medical history.  History reviewed. No pertinent surgical history.  There were no vitals filed for this visit.  Subjective Assessment - 10/24/17 1137    Subjective  I would like to do dry needling next session.  I am 50% better.      Patient Stated Goals  sleep better, reduce Rt UE pain, improve use of Rt UE    Currently in Pain?  Yes    Pain Score  4     Pain Location  Arm    Pain Orientation  Right    Pain Descriptors / Indicators  Sore    Pain Type  Acute pain    Pain Onset  More than a month ago    Pain Frequency  Constant    Aggravating Factors   unknown- use of Rt UE doesnt seem to change it    Pain Relieving Factors  ice                       [facility] Adult PT Treatment/Exercise - 10/24/17 0001      Exercises   Exercises  Neck;Shoulder      Neck Exercises: Machines for Strengthening   UBE (Upper Arm Bike)  Level 1x 6 minutes (3/3)   verbal cues for posture     Shoulder Exercises: Supine   Horizontal ABduction  Strengthening;Both;20 reps;Theraband    Theraband Level (Shoulder Horizontal ABduction)  Level 2 (Red)    Horizontal ABduction Limitations  on foam roll    External Rotation  Strengthening;Both;20 reps;Theraband    Theraband Level (Shoulder  External Rotation)  Level 2 (Red)    Diagonals  Strengthening;Both;20 reps;Theraband    Theraband Level (Shoulder Diagonals)  Level 2 (Red)    Diagonals Limitations  on foam roll      Shoulder Exercises: Seated   Row  Strengthening;Both;20 reps;Theraband    Theraband Level (Shoulder Row)  Level 2 (Red)    Flexion  Strengthening;Both;20 reps;Weights    Flexion Weight (lbs)  1    Abduction  Strengthening;20 reps;Weights    ABduction Weight (lbs)  1    Diagonals  Strengthening;20 reps;Both;Weights    Diagonals Weight (lbs)  1      Shoulder Exercises: Standing   Extension  Strengthening;Both;20 reps    Theraband Level (Shoulder Extension)  Level 2 (Red)      Traction   Type of Traction  Cervical    Min (lbs)  5    Max (lbs)  16    Hold Time  60    Rest Time  10    Time  15  PT Short Term Goals - 10/24/17 1145      PT SHORT TERM GOAL #1   Title  be independent in initial HEP    Status  Achieved      PT SHORT TERM GOAL #2   Title  report a 40% reduction in Rt UE with ADLs and self-care    Status  Achieved      PT SHORT TERM GOAL #3   Title  reduce Rt UE pain to report  50% improvement in quality and quantity of sleep at night    Baseline  50% better    Time  4    Period  Weeks    Status  On-going        PT Long Term Goals - 10/03/17 1228      PT LONG TERM GOAL #1   Title  be independent in advanced HEP    Time  8    Period  Weeks    Status  New    Target Date  11/28/17      PT LONG TERM GOAL #2   Title  reduce FOTO to < or = to 28% limitation    Time  8    Period  Weeks    Status  New    Target Date  11/28/17      PT LONG TERM GOAL #3   Title  report a 75% reduction in Rt UE radiculopathy to improve use of Rt UE with ADLs and self-care    Time  8    Period  Weeks    Status  New    Target Date  11/28/17      PT LONG TERM GOAL #4   Title  report waking < or = to 1x/night with Rt UE pain    Time  8    Period  Weeks    Status  New     Target Date  11/28/17      PT LONG TERM GOAL #5   Title  demonstrate neutral seated posture and report postural corrections with painting and sitting for long periods    Time  8    Period  Weeks    Status  New    Target Date  11/28/17            Plan - 10/24/17 1223    Clinical Impression Statement  Pt reports 50% overall improvement in Rt UE radiculopathy and improved sleep by 50%.  PT focused on scapular and UE strength with postural corrections with these exercises.  Pt demonstrated fatigue with last set of scapular strength exercises in supine on foam roll.  Pt continues to report improvement after traction each session.  Pt will benefit from continued skilled PT for traction to address radiculopathy, strength, manual and traction.      Rehab Potential  Excellent    PT Frequency  2x / week    PT Duration  8 weeks    PT Treatment/Interventions  ADLs/Self Care Home Management;Cryotherapy;Electrical Stimulation;Ultrasound;Traction;Iontophoresis 4mg /ml Dexamethasone;Therapeutic activities;Therapeutic exercise;Patient/family education;Neuromuscular re-education;Manual techniques;Passive range of motion;Taping;Dry needling    PT Next Visit Plan  DN if seen by PT, review red band scap exercises. Continue with mechanical cervical traction    PT Home Exercise Plan  Access Code: [email or web address]     Consulted and Agree with Plan of Care  Patient       Patient will benefit from skilled therapeutic intervention in order to improve the following deficits and impairments:  Impaired UE  functional use, Decreased activity tolerance, Postural dysfunction, Increased muscle spasms, Improper body mechanics, Pain  Visit Diagnosis: Pain in right arm  Abnormal posture  Cramp and spasm  Cervicalgia     Problem List There are no active problems to display for this patient.  Lorrene Reid, PT 10/24/17 12:28 PM  [facility] [address] [place], [place], [address] Phone: [phone]   Fax:  [phone]  Name: Regina Bautista MRN: [id number] Date of Birth: 1940/06/28

## 2017-10-26 ENCOUNTER — Ambulatory Visit: Payer: Medicare Other

## 2017-10-26 DIAGNOSIS — R252 Cramp and spasm: Secondary | ICD-10-CM | POA: Diagnosis not present

## 2017-10-26 DIAGNOSIS — R293 Abnormal posture: Secondary | ICD-10-CM | POA: Diagnosis not present

## 2017-10-26 DIAGNOSIS — M79601 Pain in right arm: Secondary | ICD-10-CM | POA: Diagnosis not present

## 2017-10-26 DIAGNOSIS — M542 Cervicalgia: Secondary | ICD-10-CM | POA: Diagnosis not present

## 2017-10-26 NOTE — Therapy (Signed)
[facility] [address] [place], [place], [address] Phone: [phone]   Fax:  [phone]  Physical Therapy Treatment  Patient Details  Name: Regina Bautista MRN: [id number] Date of Birth: April 07, 1940 Referring Provider (PT): Darrow Bussing, MD   Encounter Date: 10/26/2017  PT End of Session - 10/26/17 1221    Visit Number  8    Date for PT Re-Evaluation  11/28/17    Authorization Type  Medicare    PT Start Time  1132    PT Stop Time  1233    PT Time Calculation (min)  61 min    Activity Tolerance  Patient tolerated treatment well    Behavior During Therapy  [facility] for tasks assessed/performed       History reviewed. No pertinent past medical history.  History reviewed. No pertinent surgical history.  There were no vitals filed for this visit.  Subjective Assessment - 10/26/17 1132    Subjective  My pain is coming down to a 4/10. I feel like we are making head way.    Currently in Pain?  Yes    Pain Score  4     Pain Location  Shoulder    Pain Orientation  Right    Pain Descriptors / Indicators  Sore    Multiple Pain Sites  No                       [facility] Adult PT Treatment/Exercise - 10/26/17 0001      Neck Exercises: Machines for Strengthening   UBE (Upper Arm Bike)  L2 3x3 with postural emphasis   PTA present for status update     Shoulder Exercises: Supine   Horizontal ABduction  Strengthening;Both;20 reps;Theraband    Theraband Level (Shoulder Horizontal ABduction)  Level 2 (Red)    Horizontal ABduction Limitations  on foam roll    External Rotation  Strengthening;Both;20 reps;Theraband    Theraband Level (Shoulder External Rotation)  Level 2 (Red)    Diagonals  Strengthening;Both;20 reps;Theraband    Theraband Level (Shoulder Diagonals)  Level 2 (Red)    Diagonals Limitations  on foam roll      Shoulder Exercises: Seated   Row  Strengthening;Both;20 reps;Theraband    Theraband  Level (Shoulder Row)  Level 2 (Red)    Flexion  Strengthening;Both;20 reps;Weights   2nd set with yellow theraband 2x10   Flexion Weight (lbs)  1    Abduction  Strengthening;20 reps;Weights    ABduction Weight (lbs)  1    Diagonals  Strengthening;20 reps;Both;Weights    Diagonals Weight (lbs)  1      Traction   Type of Traction  Cervical    Min (lbs)  5   Simultaneous filing. User may not have seen previous data.   Max (lbs)  16    Hold Time  60   Simultaneous filing. User may not have seen previous data.   Rest Time  10   Simultaneous filing. User may not have seen previous data.   Time  15   Simultaneous filing. User may not have seen previous data.     Manual Therapy   Manual Therapy  Soft tissue mobilization;Myofascial release    Manual therapy comments  soft tissue elongation and trigger point release to Rt>Lt upper traps and bil cervical paraspinals       Trigger Point Dry Needling - 10/26/17 1148    Consent Given?  Yes    Muscles  Treated Upper Body  Upper trapezius   bil cervical multifidi   Upper Trapezius Response  Palpable increased muscle length;Twitch reponse elicited             PT Short Term Goals - 10/24/17 1145      PT SHORT TERM GOAL #1   Title  be independent in initial HEP    Status  Achieved      PT SHORT TERM GOAL #2   Title  report a 40% reduction in Rt UE with ADLs and self-care    Status  Achieved      PT SHORT TERM GOAL #3   Title  reduce Rt UE pain to report  50% improvement in quality and quantity of sleep at night    Baseline  50% better    Time  4    Period  Weeks    Status  On-going        PT Long Term Goals - 10/03/17 1228      PT LONG TERM GOAL #1   Title  be independent in advanced HEP    Time  8    Period  Weeks    Status  New    Target Date  11/28/17      PT LONG TERM GOAL #2   Title  reduce FOTO to < or = to 28% limitation    Time  8    Period  Weeks    Status  New    Target Date  11/28/17      PT LONG  TERM GOAL #3   Title  report a 75% reduction in Rt UE radiculopathy to improve use of Rt UE with ADLs and self-care    Time  8    Period  Weeks    Status  New    Target Date  11/28/17      PT LONG TERM GOAL #4   Title  report waking < or = to 1x/night with Rt UE pain    Time  8    Period  Weeks    Status  New    Target Date  11/28/17      PT LONG TERM GOAL #5   Title  demonstrate neutral seated posture and report postural corrections with painting and sitting for long periods    Time  8    Period  Weeks    Status  New    Target Date  11/28/17            Plan - 10/26/17 1152    Clinical Impression Statement  Pt reports continued improvement in Rt UE pain this week.  Pt with tension and trigger points in upper traps and cervical paraspinals bilaterally. Pt demonstrates improved tissue mobility and reduced pain after dry needling today.  Reduced tension in bil upper traps versus last session of needling.  Pt is independent and compliant in flexibility and strength exercises.  Pt required tactile and demo cues to reduce substitution when pt was beginning to fatigue with theraband exercise.  Pt will continue to benefit from skilled PT for strength, manual and traction to address Rt UE radiculopathy.      Rehab Potential  Excellent    PT Frequency  2x / week    PT Treatment/Interventions  ADLs/Self Care Home Management;Cryotherapy;Electrical Stimulation;Ultrasound;Traction;Iontophoresis 4mg /ml Dexamethasone;Therapeutic activities;Therapeutic exercise;Patient/family education;Neuromuscular re-education;Manual techniques;Passive range of motion;Taping;Dry needling    PT Next Visit Plan  assess response to dry needling, continue traction and postural strength exercises.  PT Home Exercise Plan  Access Code: [email or web address]     Consulted and Agree with Plan of Care  Patient       Patient will benefit from skilled therapeutic intervention in order to improve the following deficits and  impairments:  Impaired UE functional use, Decreased activity tolerance, Postural dysfunction, Increased muscle spasms, Improper body mechanics, Pain  Visit Diagnosis: Pain in right arm  Abnormal posture  Cramp and spasm  Cervicalgia     Problem List There are no active problems to display for this patient.  Lorrene Reid, PT 10/26/17 12:24 PM  [facility] [address], [address] [place], [place], [address] Phone: [phone]   Fax:  [phone]  Name: Regina Bautista MRN: [id number] Date of Birth: 22-Nov-1940

## 2017-10-31 ENCOUNTER — Ambulatory Visit: Payer: Medicare Other | Admitting: Physical Therapy

## 2017-10-31 DIAGNOSIS — M79601 Pain in right arm: Secondary | ICD-10-CM

## 2017-10-31 DIAGNOSIS — R293 Abnormal posture: Secondary | ICD-10-CM

## 2017-10-31 DIAGNOSIS — M542 Cervicalgia: Secondary | ICD-10-CM

## 2017-10-31 DIAGNOSIS — R252 Cramp and spasm: Secondary | ICD-10-CM | POA: Diagnosis not present

## 2017-10-31 NOTE — Therapy (Signed)
[facility] [address], [address] [place], [place], [address] Phone: [phone]   Fax:  [phone]  Physical Therapy Treatment  Patient Details  Name: Regina Bautista MRN: [id number] Date of Birth: 1940/04/11 Referring Provider (PT): Darrow Bussing, MD   Encounter Date: 10/31/2017  PT End of Session - 10/31/17 1223    Visit Number  9    Date for PT Re-Evaluation  11/28/17    Authorization Type  Medicare    PT Start Time  1215    PT Stop Time  1300    PT Time Calculation (min)  45 min    Activity Tolerance  Patient tolerated treatment well    Behavior During Therapy  [facility] for tasks assessed/performed       No past medical history on file.  No past surgical history on file.  There were no vitals filed for this visit.  Subjective Assessment - 10/31/17 1224    Subjective  Pain about a 3/10 this AM. Pain is down the arm this AM.     Currently in Pain?  Yes    Pain Score  4     Pain Location  --   Down Rt arm   Pain Orientation  Right    Pain Descriptors / Indicators  Dull    Aggravating Factors   unknown    Pain Relieving Factors  ice, dry needling seems to help    Multiple Pain Sites  No                       [facility] Adult PT Treatment/Exercise - 10/31/17 0001      Neck Exercises: Machines for Strengthening   UBE (Upper Arm Bike)  L2 3x3 with postural emphasis   PTA monitoring      Shoulder Exercises: Supine   Horizontal ABduction  Strengthening;Both;Theraband   2x15   Theraband Level (Shoulder Horizontal ABduction)  Level 2 (Red)    Horizontal ABduction Limitations  on foam roll    External Rotation  Strengthening;Both;Theraband   2x15   Theraband Level (Shoulder External Rotation)  Level 2 (Red)    Diagonals  Strengthening;Both;Theraband   2x15   Theraband Level (Shoulder Diagonals)  Level 2 (Red)    Diagonals Limitations  on foam roll      Shoulder Exercises: Seated   Flexion   Strengthening;Both;10 reps;Weights    Flexion Weight (lbs)  2    Abduction  Strengthening;Both;10 reps;Weights    ABduction Weight (lbs)  2    Diagonals  Strengthening;Both;10 reps;Weights    Diagonals Weight (lbs)  2      Traction   Type of Traction  Cervical    Min (lbs)  5    Max (lbs)  17    Hold Time  60    Rest Time  10    Time  15      Neck Exercises: Stretches   Lower Cervical/Upper Thoracic Stretch Limitations  Thoracic extension on roll 2x5, F/B rolling down & up 10x               PT Short Term Goals - 10/24/17 1145      PT SHORT TERM GOAL #1   Title  be independent in initial HEP    Status  Achieved      PT SHORT TERM GOAL #2   Title  report a 40% reduction in Rt UE with ADLs and self-care    Status  Achieved  PT SHORT TERM GOAL #3   Title  reduce Rt UE pain to report  50% improvement in quality and quantity of sleep at night    Baseline  50% better    Time  4    Period  Weeks    Status  On-going        PT Long Term Goals - 10/03/17 1228      PT LONG TERM GOAL #1   Title  be independent in advanced HEP    Time  8    Period  Weeks    Status  New    Target Date  11/28/17      PT LONG TERM GOAL #2   Title  reduce FOTO to < or = to 28% limitation    Time  8    Period  Weeks    Status  New    Target Date  11/28/17      PT LONG TERM GOAL #3   Title  report a 75% reduction in Rt UE radiculopathy to improve use of Rt UE with ADLs and self-care    Time  8    Period  Weeks    Status  New    Target Date  11/28/17      PT LONG TERM GOAL #4   Title  report waking < or = to 1x/night with Rt UE pain    Time  8    Period  Weeks    Status  New    Target Date  11/28/17      PT LONG TERM GOAL #5   Title  demonstrate neutral seated posture and report postural corrections with painting and sitting for long periods    Time  8    Period  Weeks    Status  New    Target Date  11/28/17            Plan - 10/31/17 1223    Clinical  Impression Statement  Pt having more arm symptoms today than she had been having, she is unsure why. Intensity of these symtpoms remains lower than previous. Pt was able to tolerate more reps with her posture strength for her functional activity tolerrance, as well as more resistance for her shoulder strength. Neither of those progressions made her pain worse. We increased mecahnical traction pull to 17# which pt also tolerated well..     Rehab Potential  Excellent    PT Frequency  2x / week    PT Duration  8 weeks    PT Treatment/Interventions  ADLs/Self Care Home Management;Cryotherapy;Electrical Stimulation;Ultrasound;Traction;Iontophoresis 4mg /ml Dexamethasone;Therapeutic activities;Therapeutic exercise;Patient/family education;Neuromuscular re-education;Manual techniques;Passive range of motion;Taping;Dry needling    PT Next Visit Plan  DN if seen by PT, traction, postural strength & endurance    PT Home Exercise Plan  Access Code: [email or web address]     Consulted and Agree with Plan of Care  Patient       Patient will benefit from skilled therapeutic intervention in order to improve the following deficits and impairments:  Impaired UE functional use, Decreased activity tolerance, Postural dysfunction, Increased muscle spasms, Improper body mechanics, Pain  Visit Diagnosis: Pain in right arm  Abnormal posture  Cramp and spasm  Cervicalgia     Problem List There are no active problems to display for this patient.   COCHRAN,JENNIFER, PTA 10/31/2017, 12:53 PM  [facility] [address] [place], [place], [address] Phone: [phone]   Fax:  [phone]  Name: COREENA RUBALCAVA MRN: [id number] Date of Birth: 1940-03-25

## 2017-11-02 ENCOUNTER — Encounter: Payer: Self-pay | Admitting: Physical Therapy

## 2017-11-02 ENCOUNTER — Ambulatory Visit: Payer: Medicare Other | Admitting: Physical Therapy

## 2017-11-02 DIAGNOSIS — R252 Cramp and spasm: Secondary | ICD-10-CM | POA: Diagnosis not present

## 2017-11-02 DIAGNOSIS — R293 Abnormal posture: Secondary | ICD-10-CM

## 2017-11-02 DIAGNOSIS — M542 Cervicalgia: Secondary | ICD-10-CM | POA: Diagnosis not present

## 2017-11-02 DIAGNOSIS — M79601 Pain in right arm: Secondary | ICD-10-CM | POA: Diagnosis not present

## 2017-11-02 NOTE — Therapy (Addendum)
[facility] [address] [place], [place], [address] Phone: [phone]   Fax:  [phone]  Physical Therapy Treatment  Patient Details  Name: Regina Bautista MRN: [id number] Date of Birth: 10-07-1940 Referring Provider (PT): Darrow Bussing, MD  Progress Note Reporting Period 10/03/17 to 11/02/17  See note below for Objective Data and Assessment of Progress/Goals.   Lorrene Reid, PT 11/07/17 10:18 AM    Encounter Date: 11/02/2017  PT End of Session - 11/02/17 1147    Visit Number  10    Date for PT Re-Evaluation  11/28/17    Authorization Type  Medicare    PT Start Time  1145    PT Stop Time  1230    PT Time Calculation (min)  45 min    Activity Tolerance  Patient tolerated treatment well    Behavior During Therapy  [facility] for tasks assessed/performed       History reviewed. No pertinent past medical history.  History reviewed. No pertinent surgical history.  There were no vitals filed for this visit.  Subjective Assessment - 11/02/17 1148    Subjective  Pt was able to clean out her garage yesterday. RT shoulder is not "bad" today.    Currently in Pain?  Yes    Pain Score  3     Pain Location  Shoulder    Pain Orientation  Right    Multiple Pain Sites  No         [facility] PT Assessment - 11/02/17 0001      Observation/Other Assessments   Focus on Therapeutic Outcomes (FOTO)   32% limitation                   [facility] Adult PT Treatment/Exercise - 11/02/17 0001      Neck Exercises: Machines for Strengthening   UBE (Upper Arm Bike)  L2 3x3 with postural emphasis   PTA monitoring      Shoulder Exercises: Supine   Horizontal ABduction  Strengthening;Both;Theraband   2x15   Theraband Level (Shoulder Horizontal ABduction)  Level 2 (Red)    Horizontal ABduction Limitations  on foam roll    External Rotation  Strengthening;Both;Theraband   2x15   Theraband Level (Shoulder External  Rotation)  Level 2 (Red)    Diagonals  Strengthening;Both;Theraband   2x15   Theraband Level (Shoulder Diagonals)  Level 2 (Red)    Diagonals Limitations  on foam roll      Shoulder Exercises: Seated   Flexion  Strengthening;Both;10 reps;Weights    Flexion Weight (lbs)  2    Abduction  Strengthening;Both;10 reps;Weights    ABduction Weight (lbs)  2    Diagonals  Strengthening;Both;10 reps;Weights    Diagonals Weight (lbs)  2      Traction   Type of Traction  Cervical    Min (lbs)  5    Max (lbs)  17    Hold Time  60    Rest Time  10    Time  15               PT Short Term Goals - 11/02/17 1151      PT SHORT TERM GOAL #2   Title  report a 40% reduction in Rt UE with ADLs and self-care    Time  4    Period  Weeks    Status  Achieved   70%     PT SHORT TERM GOAL #3   Title  reduce Rt UE pain to report  50% improvement in quality and quantity of sleep at night    Time  4    Period  Weeks    Status  Achieved   80%       PT Long Term Goals - 11/02/17 1151      PT LONG TERM GOAL #1   Title  be independent in advanced HEP    Time  8    Period  Weeks    Status  On-going   Currently working on     PT LONG TERM GOAL #3   Title  report a 75% reduction in Rt UE radiculopathy to improve use of Rt UE with ADLs and self-care    Time  8    Period  Weeks    Status  On-going   70%     PT LONG TERM GOAL #4   Title  report waking < or = to 1x/night with Rt UE pain    Time  8    Period  Weeks    Status  Achieved   does not wake her up     PT LONG TERM GOAL #5   Title  demonstrate neutral seated posture and report postural corrections with painting and sitting for long periods    Time  8    Period  Weeks    Status  On-going   Needs verbal reminders, then she can self correct           Plan - 11/02/17 1147    Clinical Impression Statement  Pt reports overall and ADLs are 70% improved, sleep quality and quanity 80% improved and FOTO score has also  improved to 32%, predicted is 28%. Pt was able to clean out her garage yesterday with minimal RTUE symptoms. Pt reports she really doesn't have "pain" anymore, but more of a soreness. Pt has 2# weights now at home and will begin to use them for her deltoid strengthening.     Rehab Potential  Excellent    PT Frequency  2x / week    PT Duration  8 weeks    PT Treatment/Interventions  ADLs/Self Care Home Management;Cryotherapy;Electrical Stimulation;Ultrasound;Traction;Iontophoresis 4mg /ml Dexamethasone;Therapeutic activities;Therapeutic exercise;Patient/family education;Neuromuscular re-education;Manual techniques;Passive range of motion;Taping;Dry needling    PT Next Visit Plan  DN if seen by PT, traction, postural strength & endurance    PT Home Exercise Plan  Access Code: [email or web address]     Consulted and Agree with Plan of Care  Patient       Patient will benefit from skilled therapeutic intervention in order to improve the following deficits and impairments:  Impaired UE functional use, Decreased activity tolerance, Postural dysfunction, Increased muscle spasms, Improper body mechanics, Pain  Visit Diagnosis: Pain in right arm  Abnormal posture  Cervicalgia  Cramp and spasm     Problem List There are no active problems to display for this patient.   Wiley Magan, PTA 11/02/2017, 12:14 PM  [facility] [address], [address] [place], [place], [address] Phone: [phone]   Fax:  [phone]  Name: DORATHEA FAERBER MRN: [id number] Date of Birth: 02/26/1940

## 2017-11-07 ENCOUNTER — Ambulatory Visit: Payer: Medicare Other | Attending: Family Medicine

## 2017-11-07 DIAGNOSIS — M542 Cervicalgia: Secondary | ICD-10-CM | POA: Diagnosis not present

## 2017-11-07 DIAGNOSIS — R293 Abnormal posture: Secondary | ICD-10-CM | POA: Insufficient documentation

## 2017-11-07 DIAGNOSIS — R252 Cramp and spasm: Secondary | ICD-10-CM | POA: Diagnosis not present

## 2017-11-07 DIAGNOSIS — M79601 Pain in right arm: Secondary | ICD-10-CM | POA: Insufficient documentation

## 2017-11-07 NOTE — Therapy (Signed)
[facility] [address], [address] [place], [place], [address] Phone: [phone]   Fax:  [phone]  Physical Therapy Treatment  Patient Details  Name: Regina Bautista MRN: [id number] Date of Birth: [birth year] Referring Provider (PT): Darrow Bussing, MD   Encounter Date: 11/07/2017  PT End of Session - 11/07/17 1230    Visit Number  11    Date for PT Re-Evaluation  11/28/17    Authorization Type  Medicare    PT Start Time  1145    PT Stop Time  1241    PT Time Calculation (min)  56 min    Activity Tolerance  Patient tolerated treatment well    Behavior During Therapy  [facility] for tasks assessed/performed       History reviewed. No pertinent past medical history.  History reviewed. No pertinent surgical history.  There were no vitals filed for this visit.  Subjective Assessment - 11/07/17 1155    Subjective  I feel 70% better overall.  The dry needling has helped and I want to do one more time before discharge.      Currently in Pain?  Yes    Pain Score  2     Pain Orientation  Right    Pain Descriptors / Indicators  Dull    Pain Type  Acute pain    Pain Onset  More than a month ago    Pain Frequency  Constant    Aggravating Factors   overuse    Pain Relieving Factors  dry needling, ice                       [facility] Adult PT Treatment/Exercise - 11/07/17 0001      Neck Exercises: Machines for Strengthening   UBE (Upper Arm Bike)  L2 3x3 with postural emphasis   PTA monitoring      Shoulder Exercises: Supine   Horizontal ABduction  Strengthening;Both;Theraband   2x15   Theraband Level (Shoulder Horizontal ABduction)  Level 2 (Red)    Horizontal ABduction Limitations  on foam roll    External Rotation  Strengthening;Both;Theraband   2x15   Theraband Level (Shoulder External Rotation)  Level 2 (Red)    Diagonals  Strengthening;Both;Theraband   2x15   Theraband Level (Shoulder Diagonals)  Level 2  (Red)    Diagonals Limitations  on foam roll      Traction   Type of Traction  Cervical    Min (lbs)  5    Max (lbs)  17    Hold Time  60    Rest Time  10    Time  15      Manual Therapy   Manual Therapy  Soft tissue mobilization;Myofascial release    Manual therapy comments  soft tissue elongation and trigger point release to Rt>Lt upper and rhomboids and thoracic spine       Trigger Point Dry Needling - 11/07/17 1156    Consent Given?  Yes    Muscles Treated Upper Body  Rhomboids;Upper trapezius   thoracic multifidi   Upper Trapezius Response  Twitch reponse elicited;Palpable increased muscle length    Rhomboids Response  Twitch response elicited;Palpable increased muscle length             PT Short Term Goals - 11/07/17 1151      PT SHORT TERM GOAL #2   Title  report a 40% reduction in Rt UE with ADLs and self-care  Status  Achieved      PT SHORT TERM GOAL #3   Title  reduce Rt UE pain to report  50% improvement in quality and quantity of sleep at night    Status  Achieved        PT Long Term Goals - 11/02/17 1151      PT LONG TERM GOAL #1   Title  be independent in advanced HEP    Time  8    Period  Weeks    Status  On-going   Currently working on     PT LONG TERM GOAL #3   Title  report a 75% reduction in Rt UE radiculopathy to improve use of Rt UE with ADLs and self-care    Time  8    Period  Weeks    Status  On-going   70%     PT LONG TERM GOAL #4   Title  report waking < or = to 1x/night with Rt UE pain    Time  8    Period  Weeks    Status  Achieved   does not wake her up     PT LONG TERM GOAL #5   Title  demonstrate neutral seated posture and report postural corrections with painting and sitting for long periods    Time  8    Period  Weeks    Status  On-going   Needs verbal reminders, then she can self correct           Plan - 11/07/17 1229    Clinical Impression Statement  Pt reports 70% overall improvement in Rt UE pain  since the start of care.  Pt denies any sleep limitations at this time.  Pt with tension and trigger points in Rt thoracic paraspinals and rhomboids and demonstrated improved tissue mobility and reduced pain after dry needling and manual today. Pt will continue to benefit from skilled PT for manual therapy to address trigger points and muscle tension, traction and scapular strength progression.      Rehab Potential  Excellent    PT Frequency  2x / week    PT Duration  8 weeks    PT Treatment/Interventions  ADLs/Self Care Home Management;Cryotherapy;Electrical Stimulation;Ultrasound;Traction;Iontophoresis 4mg /ml Dexamethasone;Therapeutic activities;Therapeutic exercise;Patient/family education;Neuromuscular re-education;Manual techniques;Passive range of motion;Taping;Dry needling    PT Next Visit Plan  Dry needling 1x/wk, manual to Rt scapula and mobs to thoracic spine, postural strength.      PT Home Exercise Plan  Access Code: [email or web address]     Consulted and Agree with Plan of Care  Patient       Patient will benefit from skilled therapeutic intervention in order to improve the following deficits and impairments:  Impaired UE functional use, Decreased activity tolerance, Postural dysfunction, Increased muscle spasms, Improper body mechanics, Pain  Visit Diagnosis: Pain in right arm  Abnormal posture  Cervicalgia  Cramp and spasm     Problem List There are no active problems to display for this patient.   Lorrene Reid, PT 11/07/17 12:40 PM   [facility] [address] [place], [place], [address] Phone: [phone]   Fax:  [phone]  Name: Regina Bautista MRN: [id number] Date of Birth: [birth year]

## 2017-11-09 ENCOUNTER — Ambulatory Visit: Payer: Medicare Other

## 2017-11-09 DIAGNOSIS — R293 Abnormal posture: Secondary | ICD-10-CM | POA: Diagnosis not present

## 2017-11-09 DIAGNOSIS — R252 Cramp and spasm: Secondary | ICD-10-CM | POA: Diagnosis not present

## 2017-11-09 DIAGNOSIS — M79601 Pain in right arm: Secondary | ICD-10-CM

## 2017-11-09 DIAGNOSIS — M542 Cervicalgia: Secondary | ICD-10-CM | POA: Diagnosis not present

## 2017-11-09 NOTE — Therapy (Signed)
[facility] [address], [address] [place], [place], [address] Phone: [phone]   Fax:  [phone]  Physical Therapy Treatment  Patient Details  Name: Regina Bautista MRN: [id number] Date of Birth: 11-25-1940 Referring Provider (PT): Darrow Bussing, MD   Encounter Date: 11/09/2017  PT End of Session - 11/09/17 1221    Visit Number  12    Date for PT Re-Evaluation  11/28/17    Authorization Type  Medicare    PT Start Time  1143    PT Stop Time  1234    PT Time Calculation (min)  51 min    Activity Tolerance  Patient tolerated treatment well    Behavior During Therapy  [facility] for tasks assessed/performed       History reviewed. No pertinent past medical history.  History reviewed. No pertinent surgical history.  There were no vitals filed for this visit.  Subjective Assessment - 11/09/17 1135    Subjective  I was a little sore after dry needling but not too bad.      Patient Stated Goals  sleep better, reduce Rt UE pain, improve use of Rt UE    Currently in Pain?  Yes    Pain Score  2     Pain Location  Shoulder    Pain Orientation  Right    Pain Descriptors / Indicators  Dull                       [facility] Adult PT Treatment/Exercise - 11/09/17 0001      Neck Exercises: Machines for Strengthening   UBE (Upper Arm Bike)  L2 3x3 with postural emphasis   PTA monitoring      Shoulder Exercises: Seated   Flexion  Strengthening;Both;10 reps;Weights   seated on red ball   Flexion Weight (lbs)  2    Abduction  Strengthening;Both;10 reps;Weights   seated on red ball   ABduction Weight (lbs)  2    Diagonals  Strengthening;Both;10 reps;Weights   seated on red ball   Diagonals Weight (lbs)  2      Traction   Type of Traction  Cervical    Min (lbs)  5    Max (lbs)  17    Hold Time  60    Rest Time  10    Time  15      Manual Therapy   Manual Therapy  Soft tissue mobilization;Myofascial release    Manual therapy comments  soft tissue elongation and trigger point release to Rt>Lt upper and rhomboids and thoracic spine    Soft tissue mobilization  --               PT Short Term Goals - 11/07/17 1151      PT SHORT TERM GOAL #2   Title  report a 40% reduction in Rt UE with ADLs and self-care    Status  Achieved      PT SHORT TERM GOAL #3   Title  reduce Rt UE pain to report  50% improvement in quality and quantity of sleep at night    Status  Achieved        PT Long Term Goals - 11/02/17 1151      PT LONG TERM GOAL #1   Title  be independent in advanced HEP    Time  8    Period  Weeks    Status  On-going   Currently working on  PT LONG TERM GOAL #3   Title  report a 75% reduction in Rt UE radiculopathy to improve use of Rt UE with ADLs and self-care    Time  8    Period  Weeks    Status  On-going   70%     PT LONG TERM GOAL #4   Title  report waking < or = to 1x/night with Rt UE pain    Time  8    Period  Weeks    Status  Achieved   does not wake her up     PT LONG TERM GOAL #5   Title  demonstrate neutral seated posture and report postural corrections with painting and sitting for long periods    Time  8    Period  Weeks    Status  On-going   Needs verbal reminders, then she can self correct           Plan - 11/09/17 1149    Clinical Impression Statement  Pt reports 70% overall improvement in Rt UE pain since the start of care.  Pt denies any sleep limitations at this time.  Pt with tension and trigger points in Rt thoracic paraspinals and rhomboids and demonstrated improved tissue mobility and reduced pain manual today. Pt with reduced thoracic PA mobility with mobs. PT able to get behind scapula today due to improved tissue flexibility overall. Pt will continue to benefit from skilled PT for manual therapy to address trigger points and muscle tension, traction and scapular strength progression.      Rehab Potential  Excellent    PT Frequency   2x / week    PT Duration  8 weeks    PT Treatment/Interventions  ADLs/Self Care Home Management;Cryotherapy;Electrical Stimulation;Ultrasound;Traction;Iontophoresis 4mg /ml Dexamethasone;Therapeutic activities;Therapeutic exercise;Patient/family education;Neuromuscular re-education;Manual techniques;Passive range of motion;Taping;Dry needling    PT Next Visit Plan  Dry needling 1x/wk, manual to Rt scapula and mobs to thoracic spine, postural strength.      PT Home Exercise Plan  Access Code: [email or web address]     Consulted and Agree with Plan of Care  Patient       Patient will benefit from skilled therapeutic intervention in order to improve the following deficits and impairments:  Impaired UE functional use, Decreased activity tolerance, Postural dysfunction, Increased muscle spasms, Improper body mechanics, Pain  Visit Diagnosis: Pain in right arm  Abnormal posture  Cervicalgia  Cramp and spasm     Problem List There are no active problems to display for this patient.  Lorrene Reid, PT 11/09/17 12:23 PM  [facility] [address], [address] [place], [place], [address] Phone: [phone]   Fax:  [phone]  Name: Regina Bautista MRN: [id number] Date of Birth: 07/03/1940

## 2017-11-17 ENCOUNTER — Ambulatory Visit: Payer: Medicare Other

## 2017-11-17 DIAGNOSIS — R252 Cramp and spasm: Secondary | ICD-10-CM | POA: Diagnosis not present

## 2017-11-17 DIAGNOSIS — R293 Abnormal posture: Secondary | ICD-10-CM | POA: Diagnosis not present

## 2017-11-17 DIAGNOSIS — M542 Cervicalgia: Secondary | ICD-10-CM | POA: Diagnosis not present

## 2017-11-17 DIAGNOSIS — M79601 Pain in right arm: Secondary | ICD-10-CM

## 2017-11-17 NOTE — Therapy (Signed)
[facility] [address], [address] [place], [place], [address] Phone: [phone]   Fax:  [phone]  Physical Therapy Treatment  Patient Details  Name: Regina Bautista MRN: [id number] Date of Birth: [birth year] Referring Provider (PT): Darrow BussingKoirala, Dibas, MD   Encounter Date: 11/17/2017  PT End of Session - 11/17/17 1100    Visit Number  13    Date for PT Re-Evaluation  11/28/17    Authorization Type  Medicare    PT Start Time  1016    PT Stop Time  1113    PT Time Calculation (min)  57 min    Activity Tolerance  Patient tolerated treatment well    Behavior During Therapy  [facility] for tasks assessed/performed       History reviewed. No pertinent past medical history.  History reviewed. No pertinent surgical history.  There were no vitals filed for this visit.  Subjective Assessment - 11/17/17 1018    Subjective  75% overall improvement in Rt UE pain.    Currently in Pain?  Yes    Pain Score  1     Pain Location  Shoulder    Pain Orientation  Right    Pain Descriptors / Indicators  Tightness                       [facility] Adult PT Treatment/Exercise - 11/17/17 0001      Exercises   Exercises  Neck;Shoulder      Neck Exercises: Machines for Strengthening   UBE (Upper Arm Bike)  L2 3x3 with postural emphasis   PTA monitoring      Shoulder Exercises: Supine   Horizontal ABduction  Strengthening;Both;Theraband   2x15   Theraband Level (Shoulder Horizontal ABduction)  Level 3 (Green)    Horizontal ABduction Limitations  on foam roll    External Rotation  Strengthening;Both;Theraband   2x15   Theraband Level (Shoulder External Rotation)  Level 3 (Green)    Diagonals  Strengthening;Both;Theraband   2x15   Theraband Level (Shoulder Diagonals)  Level 3 (Green)    Diagonals Limitations  on foam roll      Shoulder Exercises: Seated   Flexion  Strengthening;Both;10 reps;Weights   seated on red ball   Flexion Weight (lbs)  2    Abduction  Strengthening;Both;10 reps;Weights   seated on red ball   ABduction Weight (lbs)  2    Diagonals  Strengthening;Both;10 reps;Weights   seated on red ball   Diagonals Weight (lbs)  2      Traction   Type of Traction  Cervical    Min (lbs)  5    Max (lbs)  17    Hold Time  60    Rest Time  10    Time  15      Manual Therapy   Manual Therapy  Soft tissue mobilization;Myofascial release    Manual therapy comments  soft tissue elongation and trigger point release to Rt>Lt upper and rhomboids and thoracic spine       Trigger Point Dry Needling - 11/17/17 1003    Consent Given?  Yes    Muscles Treated Upper Body  Rhomboids   thoracic multifidi   Upper Trapezius Response  Twitch reponse elicited;Palpable increased muscle length    Rhomboids Response  Twitch response elicited;Palpable increased muscle length             PT Short Term Goals - 11/07/17 1151  PT SHORT TERM GOAL #2   Title  report a 40% reduction in Rt UE with ADLs and self-care    Status  Achieved      PT SHORT TERM GOAL #3   Title  reduce Rt UE pain to report  50% improvement in quality and quantity of sleep at night    Status  Achieved        PT Long Term Goals - 11/17/17 1021      PT LONG TERM GOAL #1   Title  be independent in advanced HEP    Time  8    Period  Weeks    Status  On-going      PT LONG TERM GOAL #3   Title  report a 75% reduction in Rt UE radiculopathy to improve use of Rt UE with ADLs and self-care    Status  Achieved      PT LONG TERM GOAL #4   Title  report waking < or = to 1x/night with Rt UE pain    Status  Achieved      PT LONG TERM GOAL #5   Title  demonstrate neutral seated posture and report postural corrections with painting and sitting for long periods    Time  8    Period  Weeks    Status  On-going            Plan - 11/17/17 1033    Clinical Impression Statement  Pt reports 75% overall improvement in Rt UE pain  and denies any limitations with sleep.  Pt is making postural corrections and has returned to painting without pain. Pt was challenged by green theraband today so will stay with red band at home.   Pt with trigger points and tension in Rt>Lt rhomboids and thoracic multifidi and demonstrated improved tissue mobility after dry needling today.  Pt will continue 1x /wk for traction, needling and advancement of theraband.      Rehab Potential  Excellent    PT Frequency  2x / week    PT Duration  8 weeks    PT Treatment/Interventions  ADLs/Self Care Home Management;Cryotherapy;Electrical Stimulation;Ultrasound;Traction;Iontophoresis 4mg /ml Dexamethasone;Therapeutic activities;Therapeutic exercise;Patient/family education;Neuromuscular re-education;Manual techniques;Passive range of motion;Taping;Dry needling    PT Next Visit Plan  Dry needling 1x/wk, manual to Rt scapula and mobs to thoracic spine, postural strength.      PT Home Exercise Plan  Access Code: [email or web address]     Consulted and Agree with Plan of Care  Patient       Patient will benefit from skilled therapeutic intervention in order to improve the following deficits and impairments:  Impaired UE functional use, Decreased activity tolerance, Postural dysfunction, Increased muscle spasms, Improper body mechanics, Pain  Visit Diagnosis: Pain in right arm  Abnormal posture  Cervicalgia  Cramp and spasm     Problem List There are no active problems to display for this patient.   Lorrene Reid, PT 11/17/17 11:01 AM  [facility] [address], [address] [place], [place], [address] Phone: [phone]   Fax:  [phone]  Name: Regina Bautista MRN: [id number] Date of Birth: 1940-11-27

## 2017-11-18 ENCOUNTER — Encounter

## 2017-11-21 ENCOUNTER — Ambulatory Visit: Payer: Medicare Other

## 2017-11-21 DIAGNOSIS — R252 Cramp and spasm: Secondary | ICD-10-CM

## 2017-11-21 DIAGNOSIS — R293 Abnormal posture: Secondary | ICD-10-CM

## 2017-11-21 DIAGNOSIS — M79601 Pain in right arm: Secondary | ICD-10-CM | POA: Diagnosis not present

## 2017-11-21 DIAGNOSIS — M542 Cervicalgia: Secondary | ICD-10-CM | POA: Diagnosis not present

## 2017-11-21 NOTE — Therapy (Addendum)
[facility] [address], [place], [place], [address] Phone: [phone]   Fax:  [phone]  Physical Therapy Treatment  Patient Details  Name: Regina Bautista MRN: [id number] Date of Birth: [birth year] Referring Provider (PT): Lujean Amel, MD   Encounter Date: 11/21/2017  PT End of Session - 11/21/17 1226    Visit Number  14    Date for PT Re-Evaluation  11/28/17    Authorization Type  Medicare- KX at 15    PT Start Time  1135    PT Stop Time  1226    PT Time Calculation (min)  51 min    Activity Tolerance  Patient tolerated treatment well    Behavior During Therapy  [facility] for tasks assessed/performed       History reviewed. No pertinent past medical history.  History reviewed. No pertinent surgical history.  There were no vitals filed for this visit.  Subjective Assessment - 11/21/17 1143    Subjective  I am a little sore after dry needling but it isn't bad.  I am almost pain free and when it does bother me, it is soreness.      Currently in Pain?  No/denies         [facility] PT Assessment - 11/21/17 0001      Observation/Other Assessments   Focus on Therapeutic Outcomes (FOTO)   22% limitation                   [facility] Adult PT Treatment/Exercise - 11/21/17 0001      Neck Exercises: Machines for Strengthening   UBE (Upper Arm Bike)  L2 3x3 with postural emphasis   PTA monitoring      Shoulder Exercises: Seated   Flexion  Strengthening;Both;10 reps;Weights   seated on red ball   Flexion Weight (lbs)  2    Abduction  Strengthening;Both;10 reps;Weights   seated on red ball   ABduction Weight (lbs)  2    Diagonals  Strengthening;Both;10 reps;Weights   seated on red ball   Diagonals Weight (lbs)  2      Traction   Type of Traction  Cervical    Min (lbs)  5    Max (lbs)  17    Hold Time  60    Rest Time  10    Time  15      Manual Therapy   Manual Therapy  Soft tissue  mobilization;Myofascial release    Manual therapy comments  soft tissue elongation and trigger point release to Rt>Lt upper and rhomboids and thoracic spine       Trigger Point Dry Needling - 11/21/17 1200    Consent Given?  Yes    Muscles Treated Upper Body  Rhomboids;Upper trapezius   thoracic paraspinals   Upper Trapezius Response  Twitch reponse elicited;Palpable increased muscle length    Rhomboids Response  Twitch response elicited;Palpable increased muscle length             PT Short Term Goals - 11/07/17 1151      PT SHORT TERM GOAL #2   Title  report a 40% reduction in Rt UE with ADLs and self-care    Status  Achieved      PT SHORT TERM GOAL #3   Title  reduce Rt UE pain to report  50% improvement in quality and quantity of sleep at night    Status  Achieved        PT Long Term  Goals - 11/21/17 1143      PT LONG TERM GOAL #3   Title  report a 75% reduction in Rt UE radiculopathy to improve use of Rt UE with ADLs and self-care    Status  Achieved      PT LONG TERM GOAL #4   Title  report waking < or = to 1x/night with Rt UE pain    Status  Achieved      PT LONG TERM GOAL #5   Title  demonstrate neutral seated posture and report postural corrections with painting and sitting for long periods    Status  Achieved            Plan - 11/21/17 1157    Clinical Impression Statement  Pt denies any pain in the Rt UE, just soreness at times.  FOTO score is improved to 22% limitation.  Pt continues to have trigger points in Rt rhomboids and reduced mobility in thoracic paraspinals although this is improved over the past few weeks with dry needling.  Pt continues to respond well to traction and is able to advance to green band with some of her exercises at home.  Pt will attend 1 more session for traction, manual as needed and to finalize her HEP for continued gains.      Rehab Potential  Excellent    PT Frequency  2x / week    PT Duration  8 weeks    PT  Treatment/Interventions  ADLs/Self Care Home Management;Cryotherapy;Electrical Stimulation;Ultrasound;Traction;Iontophoresis 58m/ml Dexamethasone;Therapeutic activities;Therapeutic exercise;Patient/family education;Neuromuscular re-education;Manual techniques;Passive range of motion;Taping;Dry needling    PT Next Visit Plan  1 more session. Finalize HEP, traction and dry needling as able.      PT Home Exercise Plan  Access Code: [email or web address]    Consulted and Agree with Plan of Care  Patient       Patient will benefit from skilled therapeutic intervention in order to improve the following deficits and impairments:  Impaired UE functional use, Decreased activity tolerance, Postural dysfunction, Increased muscle spasms, Improper body mechanics, Pain  Visit Diagnosis: Pain in right arm  Abnormal posture  Cervicalgia  Cramp and spasm     Problem List There are no active problems to display for this patient.   KSigurd Sos PT 11/21/17 12:29 PM PHYSICAL THERAPY DISCHARGE SUMMARY  Visits from Start of Care: 14  Current functional level related to goals / functional outcomes: See above for current status.  Pt has HEP in place and denies pain at this time     Remaining deficits: No functional deficits remain   Education / Equipment: HEP, posture/body mechanics Plan: Patient agrees to discharge.  Patient goals were met. Patient is being discharged due to meeting the stated rehab goals.  ?????        KSigurd Sos PT 11/28/17 9:59 AM   [facility] [address] [place] [address]: [phone]  Fax:  [phone] Name: Regina DECKMANMRN: [id number] of Birth: 8February 22, 1942

## 2017-12-13 DIAGNOSIS — Z23 Encounter for immunization: Secondary | ICD-10-CM | POA: Diagnosis not present

## 2019-03-02 ENCOUNTER — Ambulatory Visit: Payer: Medicare Other | Attending: Internal Medicine

## 2019-03-02 DIAGNOSIS — Z23 Encounter for immunization: Secondary | ICD-10-CM | POA: Insufficient documentation

## 2019-03-02 NOTE — Progress Notes (Signed)
   Covid-19 Vaccination Clinic  Name:  Regina Bautista    MRN: [id number] DOB: August 01, 1940  03/02/2019  [name] was observed post Covid-19 immunization for 15 minutes without incidence. She was provided with Vaccine Information Sheet and instruction to access the V-Safe system.   [name] was instructed to call 911 with any severe reactions post vaccine: Marland Kitchen Difficulty breathing  . Swelling of your face and throat  . A fast heartbeat  . A bad rash all over your body  . Dizziness and weakness    Immunizations Administered    Name Date Dose VIS Date Route   Pfizer COVID-19 Vaccine 03/02/2019 10:05 AM 0.3 mL 12/15/2018 Intramuscular   Manufacturer: [employer], [employer]   Lot: [id number]   NDC: [address]-6

## 2019-03-27 ENCOUNTER — Ambulatory Visit: Payer: Medicare Other | Attending: Internal Medicine

## 2019-03-27 DIAGNOSIS — Z23 Encounter for immunization: Secondary | ICD-10-CM

## 2019-03-27 NOTE — Progress Notes (Signed)
   Covid-19 Vaccination Clinic  Name:  Regina Bautista    MRN: [id number] DOB: 04-15-1940  03/27/2019  Regina Bautista was observed post Covid-19 immunization for 15 minutes without incident. She was provided with Vaccine Information Sheet and instruction to access the V-Safe system.   Regina Bautista was instructed to call 911 with any severe reactions post vaccine: Marland Kitchen Difficulty breathing  . Swelling of face and throat  . A fast heartbeat  . A bad rash all over body  . Dizziness and weakness   Immunizations Administered    Name Date Dose VIS Date Route   Pfizer COVID-19 Vaccine 03/27/2019 11:53 AM 0.3 mL 12/15/2018 Intramuscular   Manufacturer: [employer], [employer]   Lot: [id number]   NDC: [address]-2

## 2020-01-13 IMAGING — DX DG CHEST 2V
2 series · 2 of 2 positions shown · non-contrast
Comparison: None.

CLINICAL DATA: Right-sided chest pain

EXAM:
CHEST - 2 VIEW

[w chest pa]
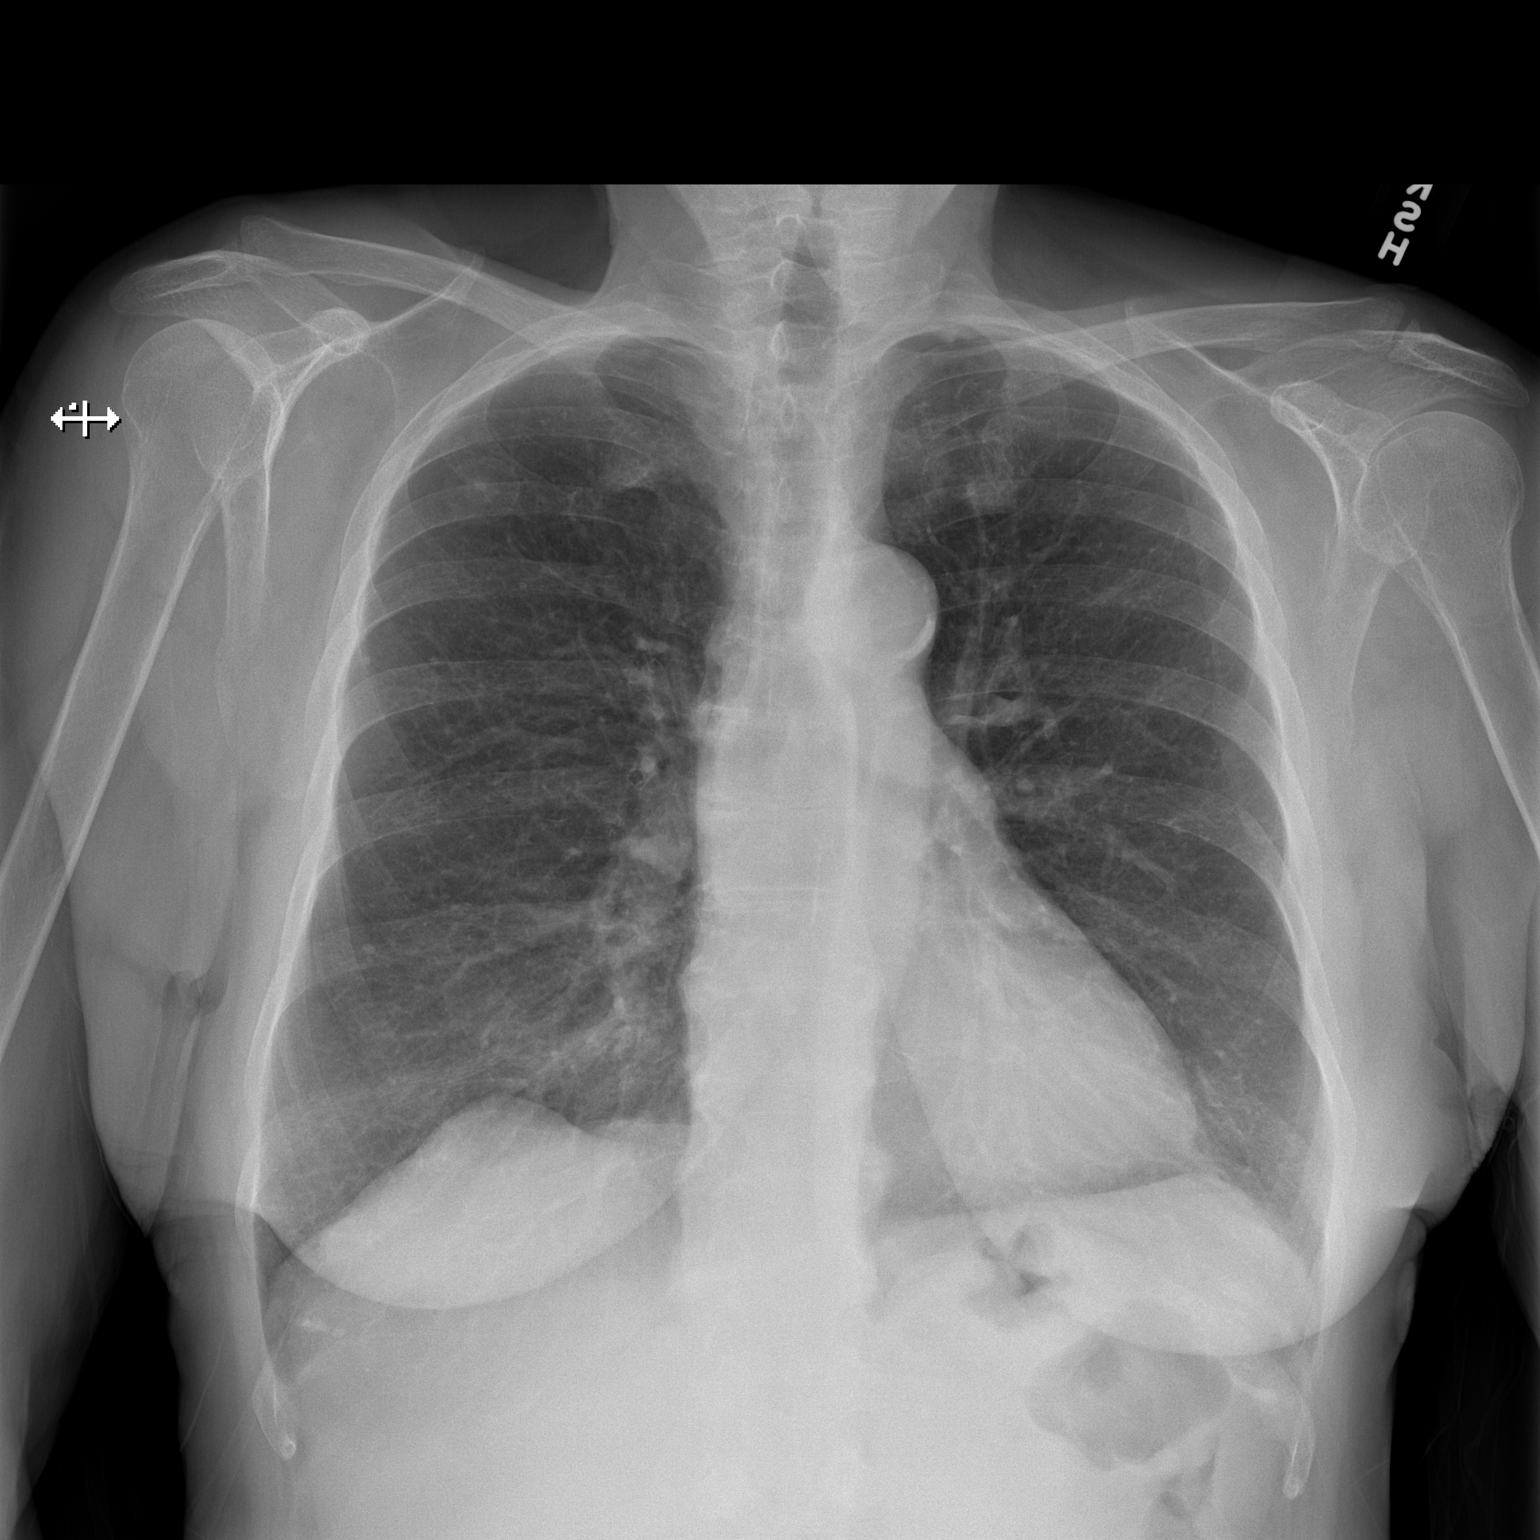

[w chest lat]
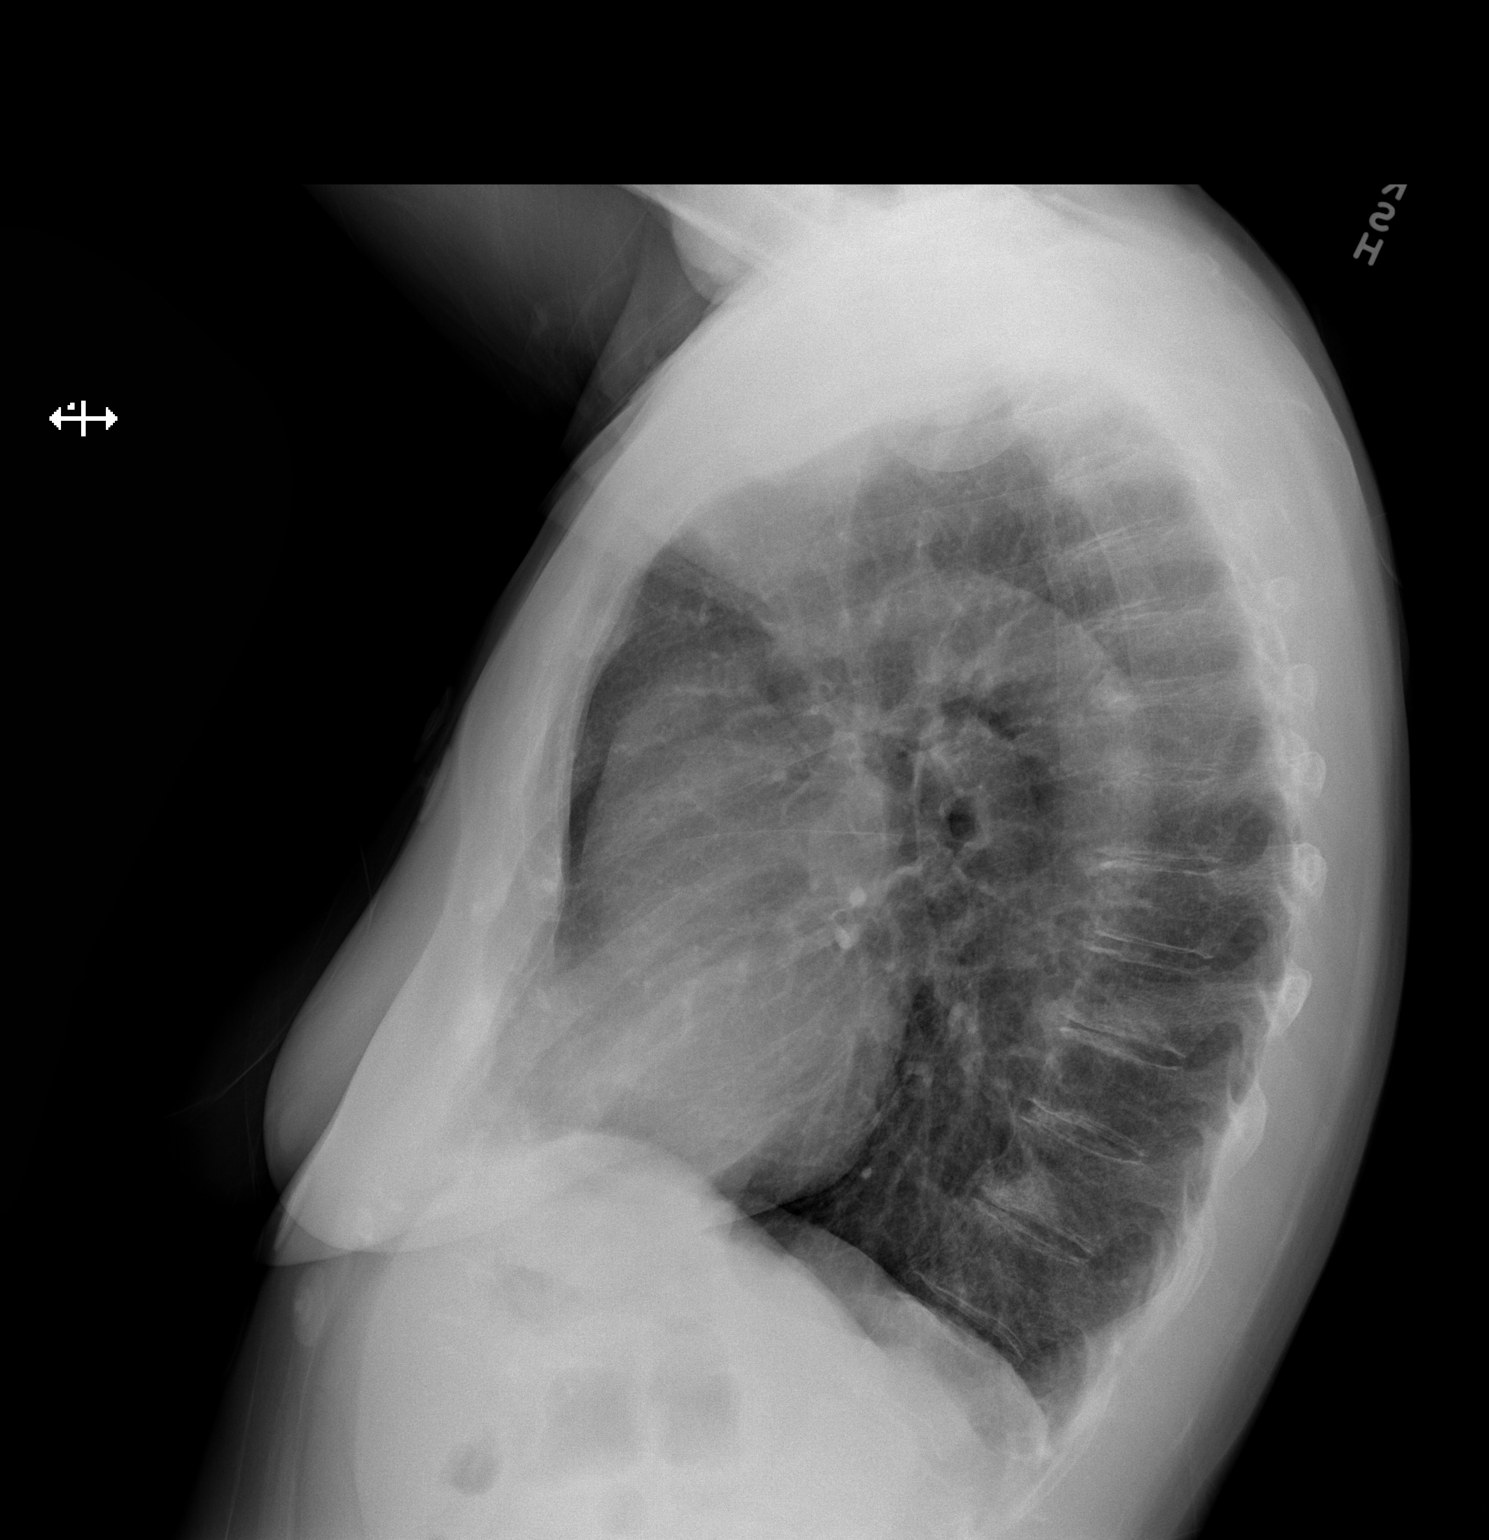

[2 of 2 positions shown; findings below may reference images not displayed]

FINDINGS: The heart size and mediastinal contours are within normal limits.
Both lungs are hyperinflated. The visualized skeletal structures are
unremarkable.
IMPRESSION: COPD without acute abnormality.

## 2022-08-31 DIAGNOSIS — J029 Acute pharyngitis, unspecified: Secondary | ICD-10-CM | POA: Diagnosis not present

## 2023-08-12 ENCOUNTER — Encounter (HOSPITAL_COMMUNITY): Payer: Self-pay

## 2023-08-12 ENCOUNTER — Ambulatory Visit (INDEPENDENT_AMBULATORY_CARE_PROVIDER_SITE_OTHER)

## 2023-08-12 ENCOUNTER — Ambulatory Visit (HOSPITAL_COMMUNITY)
Admission: EM | Admit: 2023-08-12 | Discharge: 2023-08-12 | Disposition: A | Discharge status: Home / Self Care | Attending: Emergency Medicine | Admitting: Emergency Medicine

## 2023-08-12 DIAGNOSIS — W19XXXA Unspecified fall, initial encounter: Secondary | ICD-10-CM | POA: Diagnosis not present

## 2023-08-12 DIAGNOSIS — S2232XA Fracture of one rib, left side, initial encounter for closed fracture: Secondary | ICD-10-CM

## 2023-08-12 LAB — POCT URINALYSIS DIP (MANUAL ENTRY)
Glucose, UA: NEGATIVE mg/dL
Nitrite, UA: NEGATIVE
Protein Ur, POC: 30 mg/dL — AB
Spec Grav, UA: 1.025 (ref 1.010–1.025)
Urobilinogen, UA: 0.2 U/dL
pH, UA: 5 (ref 5.0–8.0)

## 2023-08-12 NOTE — Discharge Instructions (Addendum)
 There is a fracture of your rib. This is treated with supportive care. Continue tylenol for pain. I recommend applying ice to the area.   Please go to the emergency department if symptoms worsen or become severe.  Follow up with a primary care provider regarding symptoms. I would recommend to have your urine rechecked to make sure there is no blood.  You can scan the QR code on the last page to get established with one.

## 2023-08-12 NOTE — ED Triage Notes (Addendum)
 Patient presents with left upper side pain after a fall today. Patient states she fall onto the concrete. Patient denies and LOC. Pain 06/10 Home Intervention: None

## 2023-08-12 NOTE — ED Provider Notes (Signed)
 [facility]    CSN: [id number] Arrival date & time: 08/12/23  1246      History   Chief Complaint Chief Complaint  Patient presents with   Flank Pain    HPI Regina Bautista is a 83 y.o. female.  Patient fell while walking down her sidewalk about 1 hour prior to arrival  She landed on a curved brick that lines the sidewalk. Only hit her left side. Denies landing on hip or wrist. Having left sided pain rating 6/10 at rest Took 2 tylenol before coming here  She did not hit her head, no LOC Able to get up and walk after the fall Not anticoagulated   No trouble breathing. Can take deep breath without pain.  Denies any history of blood in urine Has not gone to a primary care in many years  History reviewed. No pertinent past medical history.  There are no active problems to display for this patient.   History reviewed. No pertinent surgical history.  OB History   No obstetric history on file.      Home Medications    Prior to Admission medications   Medication Sig Start Date End Date Taking? Authorizing Provider  acetaminophen (TYLENOL) 500 MG tablet Take 1,000 mg by mouth as needed for mild pain.    [provider]  Coenzyme Q10 100 MG TABS Take 100 mg by mouth daily.    [provider]  ibuprofen (ADVIL,MOTRIN) 200 MG tablet Take 400 mg by mouth every 6 (six) hours as needed for moderate pain.    [provider]  methocarbamol  (ROBAXIN ) 500 MG tablet Take 1 tablet (500 mg total) by mouth 2 (two) times daily as needed for muscle spasms. Patient not taking: Reported on 10/03/2017 08/29/17   Freddi Hamilton, MD  traMADol (ULTRAM) 50 MG tablet Take by mouth every 6 (six) hours as needed.    [provider]  vitamin C (ASCORBIC ACID) 500 MG tablet Take 500 mg by mouth daily.    [provider]    Family History History reviewed. No pertinent family history.  Social History Social History   Tobacco Use    Smoking status: Former    Current packs/day: 0.00    Types: Cigarettes    Quit date: 08/16/2017    Years since quitting: 5.9   Smokeless tobacco: Never  Substance Use Topics   Alcohol use: Yes    Comment: occ     Allergies   Penicillins and Sulfa antibiotics   Review of Systems Review of Systems As per HPI  Physical Exam Triage Vital Signs ED Triage Vitals  Encounter Vitals Group     BP 08/12/23 1328 (!) 159/85     Girls Systolic BP Percentile --      Girls Diastolic BP Percentile --      Boys Systolic BP Percentile --      Boys Diastolic BP Percentile --      Pulse Rate 08/12/23 1328 (!) 102     Resp 08/12/23 1328 18     Temp 08/12/23 1328 97.9 F (36.6 C)     Temp Source 08/12/23 1328 Oral     SpO2 08/12/23 1328 95 %     Weight --      Height --      Head Circumference --      Peak Flow --      Pain Score 08/12/23 1335 0     Pain Loc --  Pain Education --      Exclude from Growth Chart --    No data found.  Updated Vital Signs BP (!) 159/85 (BP Location: Left Arm)   Pulse 90   Temp 97.9 F (36.6 C) (Oral)   Resp 18   SpO2 95%   Visual Acuity Right Eye Distance:   Left Eye Distance:   Bilateral Distance:    Right Eye Near:   Left Eye Near:    Bilateral Near:     Physical Exam Vitals and nursing note reviewed.  Constitutional:      General: She is not in acute distress.    Comments: Well appearing, appears younger than stated age  HENT:     Mouth/Throat:     Pharynx: Oropharynx is clear.  Cardiovascular:     Rate and Rhythm: Normal rate and regular rhythm.     Pulses: Normal pulses.     Heart sounds: Normal heart sounds.  Pulmonary:     Effort: Pulmonary effort is normal.     Breath sounds: Normal breath sounds.     Comments: Clear and equal sounds throughout. No pain with breathing  Abdominal:     Comments: No pain over CVA  Musculoskeletal:     Cervical back: Normal range of motion.       Back:     Comments: Pain on the left  side. Not back or abdomen. Point tenderness around rib 10. No obvious deformity. Abnormal movement of the rib, buoyancy.   Skin:    Capillary Refill: Capillary refill takes less than 2 seconds.  Neurological:     Mental Status: She is alert and oriented to person, place, and time.     Motor: No weakness.     Coordination: Coordination normal.     Gait: Gait normal.     UC Treatments / Results  Labs (all labs ordered are listed, but only abnormal results are displayed) Labs Reviewed  POCT URINALYSIS DIP (MANUAL ENTRY) - Abnormal; Notable for the following components:      Result Value   Color, UA straw (*)    Bilirubin, UA small (*)    Ketones, POC UA trace (5) (*)    Blood, UA trace-lysed (*)    Protein Ur, POC =30 (*)    Leukocytes, UA Trace (*)    All other components within normal limits    EKG   Radiology DG Ribs Unilateral W/Chest Left Result Date: 08/12/2023 CLINICAL DATA:  Fall with left-sided chest pain. EXAM: LEFT RIBS AND CHEST - 3+ VIEW COMPARISON:  08/29/2017 FINDINGS: Lungs are adequately inflated without focal airspace consolidation or effusion. No pneumothorax. Cardiomediastinal silhouette is normal. Minimally displaced fracture involving the anterior left ninth rib. IMPRESSION: 1. No acute cardiopulmonary disease. 2. Minimally displaced anterior left ninth rib fracture. Electronically Signed   By: Toribio Agreste M.D.   On: 08/12/2023 15:08    Procedures Procedures (including critical care time)  Medications Ordered in UC Medications - No data to display  Initial Impression / Assessment and Plan / UC Course  I have reviewed the triage vital signs and the nursing notes.  Pertinent labs & imaging results that were available during my care of the patient were reviewed by me and considered in my medical decision making (see chart for details).  UA with trace blood. Patient denies history of this. Unclear if this is related to her injury - did discuss with  patient the potential for kidney injury with a fall onto her  ribs. She has not been seen by a PCP in a long time. There is no past urine analysis in her chart.  However she is well appearing, pain only with palpation, normal breathing and overall feeling healthy. I have recommended chest/ribs xray today. Imaging results with minimally displaced 9th rib fracture.  Discussed with patient. We have discussed very strict precautions for evaluation in the emergency department.  Patient verbalizes understanding  QR code provided for setting up with PCP. Follow up regarding healing and hematuria.   Final Clinical Impressions(s) / UC Diagnoses   Final diagnoses:  Fall, initial encounter  Closed fracture of one rib of left side, initial encounter     Discharge Instructions      There is a fracture of your rib. This is treated with supportive care. Continue tylenol for pain. I recommend applying ice to the area.   Please go to the emergency department if symptoms worsen or become severe.  Follow up with a primary care provider regarding symptoms. I would recommend to have your urine rechecked to make sure there is no blood.  You can scan the QR code on the last page to get established with one.     ED Prescriptions   None    PDMP not reviewed this encounter.   Jeryl Stabs, PA-C 08/12/23 1545
